# Patient Record
Sex: Male | Born: 1950 | Race: White | Hispanic: No | Marital: Single | Smoking: Former smoker
Health system: Southern US, Community
[De-identification: ages and names within clinical notes are randomized; demographics above are authoritative.]

## PROBLEM LIST (undated history)

## (undated) DIAGNOSIS — E785 Hyperlipidemia, unspecified: Secondary | ICD-10-CM

## (undated) DIAGNOSIS — Z720 Tobacco use: Secondary | ICD-10-CM

## (undated) DIAGNOSIS — I639 Cerebral infarction, unspecified: Secondary | ICD-10-CM

## (undated) DIAGNOSIS — I10 Essential (primary) hypertension: Secondary | ICD-10-CM

## (undated) DIAGNOSIS — I2510 Atherosclerotic heart disease of native coronary artery without angina pectoris: Secondary | ICD-10-CM

## (undated) DIAGNOSIS — T50995A Adverse effect of other drugs, medicaments and biological substances, initial encounter: Secondary | ICD-10-CM

## (undated) DIAGNOSIS — R739 Hyperglycemia, unspecified: Secondary | ICD-10-CM

## (undated) DIAGNOSIS — K219 Gastro-esophageal reflux disease without esophagitis: Secondary | ICD-10-CM

## (undated) HISTORY — PX: CHOLECYSTECTOMY: SHX55

## (undated) HISTORY — DX: Essential (primary) hypertension: I10

## (undated) HISTORY — DX: Hyperglycemia, unspecified: R73.9

## (undated) HISTORY — DX: Tobacco use: Z72.0

## (undated) HISTORY — DX: Adverse effect of other drugs, medicaments and biological substances, initial encounter: T50.995A

## (undated) HISTORY — DX: Hyperlipidemia, unspecified: E78.5

## (undated) HISTORY — DX: Atherosclerotic heart disease of native coronary artery without angina pectoris: I25.10

## (undated) HISTORY — DX: Gastro-esophageal reflux disease without esophagitis: K21.9

## (undated) HISTORY — PX: ROTATOR CUFF REPAIR: SHX139

---

## 2001-09-03 ENCOUNTER — Ambulatory Visit (HOSPITAL_COMMUNITY): Admission: RE | Admit: 2001-09-03 | Discharge: 2001-09-03 | Payer: Self-pay | Admitting: *Deleted

## 2004-04-10 ENCOUNTER — Encounter: Payer: Self-pay | Admitting: Physician Assistant

## 2010-10-03 ENCOUNTER — Encounter: Payer: Self-pay | Admitting: Cardiology

## 2010-10-04 ENCOUNTER — Encounter: Payer: Self-pay | Admitting: Cardiology

## 2010-10-05 ENCOUNTER — Encounter: Payer: Self-pay | Admitting: Cardiology

## 2010-10-06 ENCOUNTER — Inpatient Hospital Stay (HOSPITAL_COMMUNITY)
Admission: AD | Admit: 2010-10-06 | Discharge: 2010-10-09 | Payer: Self-pay | Attending: Internal Medicine | Admitting: Internal Medicine

## 2010-10-06 ENCOUNTER — Ambulatory Visit: Payer: Self-pay | Admitting: Cardiology

## 2010-10-06 ENCOUNTER — Encounter: Payer: Self-pay | Admitting: Cardiology

## 2010-10-06 ENCOUNTER — Encounter: Payer: Self-pay | Admitting: Physician Assistant

## 2010-10-06 DIAGNOSIS — R079 Chest pain, unspecified: Secondary | ICD-10-CM | POA: Insufficient documentation

## 2010-10-06 DIAGNOSIS — I10 Essential (primary) hypertension: Secondary | ICD-10-CM | POA: Insufficient documentation

## 2010-10-06 DIAGNOSIS — F17200 Nicotine dependence, unspecified, uncomplicated: Secondary | ICD-10-CM | POA: Insufficient documentation

## 2010-10-06 DIAGNOSIS — K219 Gastro-esophageal reflux disease without esophagitis: Secondary | ICD-10-CM | POA: Insufficient documentation

## 2010-10-06 DIAGNOSIS — E785 Hyperlipidemia, unspecified: Secondary | ICD-10-CM | POA: Insufficient documentation

## 2010-10-07 ENCOUNTER — Encounter: Payer: Self-pay | Admitting: Internal Medicine

## 2010-10-09 ENCOUNTER — Encounter: Payer: Self-pay | Admitting: Internal Medicine

## 2010-10-16 ENCOUNTER — Encounter: Payer: Self-pay | Admitting: Cardiology

## 2010-10-16 DIAGNOSIS — I2510 Atherosclerotic heart disease of native coronary artery without angina pectoris: Secondary | ICD-10-CM | POA: Insufficient documentation

## 2010-10-19 ENCOUNTER — Ambulatory Visit: Payer: Self-pay | Admitting: Cardiology

## 2010-11-28 NOTE — Assessment & Plan Note (Signed)
Summary: ROV ABNORMAL STRESS TEST AND CHEST PAIN   Visit Type:  Initial Consult Primary Provider:  Ashish Shah,MD  CC:  chest pain.  History of Present Illness: The patient is seen for evaluation of chest pain.  He had recently been hospitalized and appeared to be stable.  He had a nuclear exercise test on the day of his discharge.  There is question of inferior ischemia.  It was not a high risk scan and he was allowed to go home.  Decision was made for him to be seen rapidly in the office.  He's feeling relatively well today.  He has pain in his left chest which may be musculoskeletal.  However we are concerned about his nuclear scan.  In addition the patient's blood pressure was 180/125 when he was admitted to the hospital.  His blood pressure appeared to stabilize but today in the office his blood pressures 180/133 and one arm and 185/129 in the other arm..  Therefore he'll be admitted for blood pressure control and then proceed with cardiac catheterization and assessment of his renal arteries.  His chest pain is along the left lower anterior chest and left flank.  It has been persistent for several days.  The patient had undergone cardiac catheterization in 2002.  There was mild coronary disease at that time.  Preventive Screening-Counseling & Management  Alcohol-Tobacco     Smoking Status: quit     Year Quit: 1993     Cans of tobacco/week: chews  tobacco 2 packs/week     Tobacco Counseling: not to resume use of tobacco products  Current Medications (verified): 1)  Diovan 160 Mg Tabs (Valsartan) .... Take 1 Tablet By Mouth Once A Day 2)  Omeprazole 20 Mg Cpdr (Omeprazole) .... Take 1 Tablet By Mouth Once A Day 3)  Aspir-Low 81 Mg Tbec (Aspirin) .... Take 1 Tablet By Mouth Once A Day 4)  Androgel 25 Mg/2.5gm Gel (Testosterone) .... Apply One Packet Daily  Allergies (verified): 1)  ! Pcn 2)  ! * Ivp Dye  Comments:  Nurse/Medical Assistant: The patient's medication bottles and  allergies were reviewed with the patient and were updated in the Medication and Allergy Lists.  Past History:  Past Medical History: chest pain..... cardiac catheterization with noncritical disease 2002 /   nuclear scan October 05, 2010... inferior ischemia Dyslipidemia   intolerant to statins History of IVP dye allergy GERD Tobacco abuse Hypertension severe  Family History: There is a family history of coronary artery disease.  Social History: The patient is married.  He does use chewing tobacco.  Smoking Status:  quit Cans of tobacco/week:  chews  tobacco 2 packs/week  Review of Systems       The patient denies fever, chills, headache, sweats, rash, change in vision, change in hearing, chest pain, cough, nausea vomiting, urinary symptoms.  All other systems are reviewed and are negative.  Vital Signs:  Patient profile:   60 year old male Height:      67 inches Weight:      171 pounds BMI:     26.88 O2 Sat:      99 % on Room air Pulse rate:   67 / minute BP sitting:   182 / 133  (left arm) Cuff size:   regular  Vitals Entered By: Carlye Grippe (October 06, 2010 1:39 PM)  Nutrition Counseling: Patient's BMI is greater than 25 and therefore counseled on weight management options.  O2 Flow:  Room air  Serial  Vital Signs/Assessments:  Time      Position  BP       Pulse  Resp  Temp     By 1:44 PM             177/128  68                    Lydia Anderson                                PEF    PreRx  PostRx Time      O2 Sat  O2 Type     L/min  L/min  L/min   By 1:44 PM   98  %                                     Carlye Grippe  Comments: 1:44 PM right arm/reg cuff By: Carlye Grippe    Physical Exam  General:  patient is stable today. Head:  head is atraumatic. Eyes:  no xanthelasma. Neck:  no jugular venous distention. Chest Wall:  no chest wall tenderness. Lungs:  lungs are clear.  Respiratory effort is nonlabored. Heart:  cardiac exam reveals a fullness  superiorly no clicks or significant murmurs. Abdomen:  abdomen is soft. Msk:  no musculoskeletal deformities. Extremities:  no peripheral edema. Skin:  no skin rashes. Psych:  patient is oriented to person time and place her affect is normal.  He is here with his wife today.   Impression & Recommendations:  Problem # 1:  TOBACCO ABUSE (ICD-305.1) The patient is counseled to try to stop chewing tobacco  Problem # 2:  * IVP DYE ALLERGY There is an IVP dye allergy.  We will still cover him with possible contrast allergy , unless we can show that the patient did not require prophylaxis at the time of his catheter in 2002.  Problem # 3:  DYSLIPIDEMIA (ICD-272.4) Historically he cannot use statins.  We will try over time to see if he can try a new statin.  Problem # 4:  CHEST PAIN (ICD-786.50)  His updated medication list for this problem includes:    Aspir-low 81 Mg Tbec (Aspirin) .Marland Kitchen... Take 1 tablet by mouth once a day The patient's current chest pain is concerning.  I believe there is a musculoskeletal component to it.  However his nuclear scan reveals inferior ischemia.  He does have mild disease in the past.  He and his wife are very concerned and wanted complete evaluation.  We will proceed with cardiac catheterization.  Problem # 5:  HYPERTENSION (ICD-401.9)  His updated medication list for this problem includes:    Diovan 160 Mg Tabs (Valsartan) .Marland Kitchen... Take 1 tablet by mouth once a day    Aspir-low 81 Mg Tbec (Aspirin) .Marland Kitchen... Take 1 tablet by mouth once a day The patient's hypertension is very concerning.  It seems to go up and down significantly.  Today in the office is 180/130 bilaterally.  Is not having any symptoms of uncontrolled hypertension.  I'm very comfortable treating this as an outpatient especially when he's been having chest pain.  He'll be admitted to have his meds adjusted.  We will then proceed electively with cardiac catheterization and assess his renal  arteries.  Other Orders: EKG w/ Interpretation (93000)

## 2010-11-28 NOTE — Consult Note (Signed)
Summary: CARDIOLOGY CONSULT/ [facility]  CARDIOLOGY CONSULT/ [facility]   Imported By: Zachary George 10/06/2010 12:32:29  _____________________________________________________________________  External Attachment:    Type:   Image     Comment:   External Document

## 2010-11-30 NOTE — Assessment & Plan Note (Signed)
Summary: [facility]-POST CATH   Visit Type:  Follow-up Primary Provider:  Beatrix Fetters Shah,MD  CC:  chest pain.  History of Present Illness: The patient is seen back after his cardiac catheterization.  When I saw him in the office on October 06, 2010.  I was concerned about his blood pressure and about the results of his nuclear scan that had shown some inferior ischemia.  It is very well documented that I decided to admit him to the hospital that day because of marked swings in his blood pressure.  In the hospital his blood pressure stabilized.  He underwent cardiac catheterization on October 09, 2010.  He has mild to moderate coronary disease.  There was no critical lesions.  There appeared to be no major change since 2002.  He needs aggressive secondary prevention.  Unfortunately he is intolerant to statins.  Preventive Screening-Counseling & Management  Alcohol-Tobacco     Smoking Status: quit     Year Quit: 1993     Cans of tobacco/week: chew 2packs tobacco/week     Tobacco Counseling: not to resume use of tobacco products  Current Medications (verified): 1)  Diovan 160 Mg Tabs (Valsartan) .... Take 1 Tablet By Mouth Once A Day 2)  Omeprazole 20 Mg Cpdr (Omeprazole) .... Take 1 Tablet By Mouth Once A Day 3)  Aspir-Low 81 Mg Tbec (Aspirin) .... Take 1 Tablet By Mouth Once A Day 4)  Amlodipine Besylate 5 Mg Tabs (Amlodipine Besylate) .... Take 1 Tablet By Mouth Once A Day 5)  Carvedilol 6.25 Mg Tabs (Carvedilol) .... Take 1 Tablet By Mouth Two Times A Day 6)  Alprazolam 0.5 Mg Tabs (Alprazolam) .... Take 1 /2 Tablet By Mouth Once A Day At Bedtime 7)  Flexeril 10 Mg Tabs (Cyclobenzaprine Hcl) .... Take 1 Tablet By Mouth Three Times A Day As Needed 8)  Tylenol 325 Mg Tabs (Acetaminophen) .... Take 1-2 Tablet By Mouth Three Times A Day As Needed 9)  Gaviscon Extra Relief Formula 160-105 Mg Chew (Alum Hydroxide-Mag Carbonate) .... As Needed 10)  Nystatin-Triamcinolone 100000-0.1 Unit/gm-% Crea  (Nystatin-Triamcinolone) .... Apply Topically To Elbows Daily  Allergies (verified): 1)  ! Pcn 2)  ! * Ivp Dye  Comments:  Nurse/Medical Assistant: The patient's medication list and allergies were reviewed with the patient and were updated in the Medication and Allergy Lists.  Past History:  Past Medical History: chest pain..... cardiac catheterization with noncritical disease 2002 /   nuclear scan October 05, 2010... inferior ischemia  /  catheterization October 09, 2010, mild nonobstructive disease, medical therapy...renal arteries not studied EF  65%.... catheterization... December, 2011 IV contrast allergy??   premedicate for catheter December, 2011 Glucose elevation.... December, 2011.... needs primary care evaluation Dyslipidemia   intolerant to statins GERD Tobacco abuse Hypertension .Marland Kitchen.. pressure was high at [facility]... renal arteriogram not done at cath 09/2010... pressure stabilized with medication adjustments  Social History: Cans of tobacco/week:  chew 2packs tobacco/week  Review of Systems       Patient denies fever, chills, headache, sweats, rash, change in vision, change in hearing, chest pain, cough, nausea vomiting, urinary symptoms.  All other systems are reviewed and are negative.  Vital Signs:  Patient profile:   60 year old male Height:      67 inches Weight:      170 pounds Pulse rate:   59 / minute BP sitting:   130 / 84  (left arm) Cuff size:   regular  Vitals Entered By: Carlye Grippe (  October 19, 2010 10:11 AM)  Physical Exam  General:  Patient is stable today. Head:  head is atraumatic. Eyes:  no xanthelasma. Neck:  no jugular venous distention. Chest Wall:  no chest wall tenderness. Lungs:  lungs are clear.  Respiratory effort is nonlabored. Heart:  cardiac exam reveals an S1-S2.  No clicks or significant murmurs. Abdomen:  abdomen soft. Msk:  no musculoskeletal deformities. Extremities:  no peripheral edema. Psych:  patient  is oriented to person time and place.  Affect is normal.   Impression & Recommendations:  Problem # 1:  CAD (ICD-414.00) Catheterization has shown only mild/moderate disease.  He needs secondary prevention.  Unfortunately he is intolerant to statins.  He is on the appropriate other medications.  It is my understanding that his insurance company may be refusing to pay for some of his recent hospitalization.  The reason for the hospitalization and the reason for cardiac catheterization are very well documented on my note of October 06, 2010. he had proven coronary disease in the past.  He had chest pain.  He had an abnormal nuclear scan.  He had blood pressure that was not well controlled at the time of the admission.  Problem # 2:  * IVP DYE ALLERGY The patient did receive prophylaxis for possible contrast allergy.  From the records available I cannot absolutely document that this problem was proven in the past.  Problem # 3:  HYPERTENSION (ICD-401.9) blood pressure is nicely controlled today.  The patient did not have assessment of his renal arteries in the catheter lab.  There is no need today for further evaluation.  His blood pressure can be followed carefully.  Patient Instructions: 1)  Your physician wants you to follow-up in: 3 months. You will receive a reminder letter in the mail one-two months in advance. If you don't receive a letter, please call our office to schedule the follow-up appointment. 2)  Your physician recommends that you continue on your current medications as directed. Please refer to the Current Medication list given to you today.

## 2010-11-30 NOTE — Miscellaneous (Signed)
  Clinical Lists Changes  Problems: Added new problem of CAD (ICD-414.00) Observations: Added new observation of PAST MED HX: chest pain..... cardiac catheterization with noncritical disease 2002 /   nuclear scan October 05, 2010... inferior ischemia  /  catheterization October 09, 2010, mild nonobstructive disease, medical therapy...renal arteries not studied EF  65%.... catheterization... December, 2011 IV contrast allergy??   premedicate for catheter December, 2011 Glucose elevation.... December, 2011.... needs primary care evaluation Dyslipidemia   intolerant to statins GERD Tobacco abuse Hypertension .Marland Kitchen.. pressure was high at [facility]... renal arteriogram not done at cath 09/2010 (10/16/2010 10:22) Added new observation of PRIMARY MD: Beatrix Fetters Shah,MD (10/16/2010 10:22)       Past History:  Past Medical History: chest pain..... cardiac catheterization with noncritical disease 2002 /   nuclear scan October 05, 2010... inferior ischemia  /  catheterization October 09, 2010, mild nonobstructive disease, medical therapy...renal arteries not studied EF  65%.... catheterization... December, 2011 IV contrast allergy??   premedicate for catheter December, 2011 Glucose elevation.... December, 2011.... needs primary care evaluation Dyslipidemia   intolerant to statins GERD Tobacco abuse Hypertension .Marland Kitchen.. pressure was high at [facility]... renal arteriogram not done at cath 09/2010

## 2010-11-30 NOTE — Letter (Signed)
Summary: Internal Correspondence/ PRE-CATH ORDER & NOTE  Internal Correspondence/ PRE-CATH ORDER & NOTE   Imported By: Dorise Hiss 10/11/2010 13:53:45  _____________________________________________________________________  External Attachment:    Type:   Image     Comment:   External Document

## 2011-01-09 LAB — CBC
HCT: 44.8 % (ref 39.0–52.0)
Hemoglobin: 14 g/dL (ref 13.0–17.0)
MCH: 29 pg (ref 26.0–34.0)
MCH: 29.1 pg (ref 26.0–34.0)
MCV: 83.6 fL (ref 78.0–100.0)
Platelets: 238 10*3/uL (ref 150–400)
RDW: 12.9 % (ref 11.5–15.5)
WBC: 6 10*3/uL (ref 4.0–10.5)

## 2011-01-09 LAB — CARDIAC PANEL(CRET KIN+CKTOT+MB+TROPI)
CK, MB: 1.1 ng/mL (ref 0.3–4.0)
CK, MB: 1.1 ng/mL (ref 0.3–4.0)
CK, MB: 1.3 ng/mL (ref 0.3–4.0)
Relative Index: 1.2 (ref 0.0–2.5)
Relative Index: INVALID (ref 0.0–2.5)
Total CK: 85 U/L (ref 7–232)
Total CK: 95 U/L (ref 7–232)
Troponin I: 0.02 ng/mL (ref 0.00–0.06)

## 2011-01-09 LAB — COMPREHENSIVE METABOLIC PANEL
ALT: 21 U/L (ref 0–53)
AST: 19 U/L (ref 0–37)
Albumin: 3.8 g/dL (ref 3.5–5.2)
Alkaline Phosphatase: 92 U/L (ref 39–117)
Calcium: 8.7 mg/dL (ref 8.4–10.5)
Chloride: 108 mEq/L (ref 96–112)
Potassium: 3.6 mEq/L (ref 3.5–5.1)
Total Bilirubin: 0.3 mg/dL (ref 0.3–1.2)
Total Protein: 6.3 g/dL (ref 6.0–8.3)

## 2011-01-09 LAB — BASIC METABOLIC PANEL
BUN: 15 mg/dL (ref 6–23)
CO2: 25 mEq/L (ref 19–32)
Creatinine, Ser: 0.89 mg/dL (ref 0.4–1.5)
Glucose, Bld: 157 mg/dL — ABNORMAL HIGH (ref 70–99)
Potassium: 3.8 mEq/L (ref 3.5–5.1)
Sodium: 140 mEq/L (ref 135–145)

## 2011-01-09 LAB — PROTIME-INR: INR: 1.09 (ref 0.00–1.49)

## 2011-01-09 LAB — LIPID PANEL: LDL Cholesterol: 113 mg/dL — ABNORMAL HIGH (ref 0–99)

## 2011-01-12 ENCOUNTER — Encounter: Payer: Self-pay | Admitting: *Deleted

## 2011-01-22 ENCOUNTER — Ambulatory Visit: Payer: Self-pay | Admitting: Cardiology

## 2011-01-22 ENCOUNTER — Encounter: Payer: Self-pay | Admitting: Cardiology

## 2011-01-22 DIAGNOSIS — Z720 Tobacco use: Secondary | ICD-10-CM | POA: Insufficient documentation

## 2011-01-22 DIAGNOSIS — K219 Gastro-esophageal reflux disease without esophagitis: Secondary | ICD-10-CM | POA: Insufficient documentation

## 2011-01-22 DIAGNOSIS — I10 Essential (primary) hypertension: Secondary | ICD-10-CM | POA: Insufficient documentation

## 2011-01-22 DIAGNOSIS — E785 Hyperlipidemia, unspecified: Secondary | ICD-10-CM | POA: Insufficient documentation

## 2011-01-22 DIAGNOSIS — I2510 Atherosclerotic heart disease of native coronary artery without angina pectoris: Secondary | ICD-10-CM | POA: Insufficient documentation

## 2011-03-16 NOTE — Cardiovascular Report (Signed)
[place]  Patient:    Matthew Ward, Matthew Ward Visit Number: [id number] MRN: [id number]          Service Type: CAT Location: [facility] [id number] Attending Physician:  Daisey Must Dictated by:   Daisey Must, M.D. [facility] Proc. Date: 09/03/01 Admit Date:  09/03/2001 Discharge Date: 09/03/2001   CC:         Wende Crease, M.D.  [facility]  Cardiac Catheterization Laboratory   Cardiac Catheterization  PROCEDURES PERFORMED: Left heart catheterization with coronary angiography and left ventriculography.  INDICATIONS: The patient is a 60 year old male with a history of hypertension and atypical chest pain. A stress echocardiogram done three months ago showed no evidence of ischemia. However, because of the recurrent nature of his chest pain, he was referred for cardiac catheterization to rule out significant coronary artery disease.  DESCRIPTION OF PROCEDURE: A 6 French sheath was placed in the right femoral artery.  Standard Judkins 6 French catheters were utilized.  Contrast was Omnipaque.  There were no complications.  RESULTS:  HEMODYNAMICS: Left ventricular pressure 168/25. Aortic pressure 180/108. There was no aortic valve gradient.  LEFT VENTRICULOGRAM: Wall motion is normal. Ejection fraction is calculated at 64%. There is trace mitral regurgitation.  CORONARY ARTERIOGRAPHY: (Right dominant).  Left main: Normal.  Left anterior descending: The left anterior descending artery has a discrete 50% stenosis in the proximal vessel. The mid vessel has a tubular 30% stenosis. There is a small first diagonal which has a 30% stenosis at its origin. The second diagonal is a large vessel.  Left circumflex: The left circumflex gives rise to a small OM-1 and a normal sized OM-2 and a small OM-3. OM-2 has a tubular 60-70% stenosis at its origin.  Right coronary artery: The right coronary artery is a very large dominant vessel. There are minor  luminal irregularities in the mid and distal vessel. The right coronary artery gives rise to a large acute marginal branch which supplies a portion of the inferior septum.  There is a small posterior descending artery and a normal sized posterolateral branch.  IMPRESSIONS: 1. Normal left ventricular systolic function. 2. Two-vessel coronary artery disease of borderline severity as described    involving the left anterior descending artery and the second obtuse    marginal branch. Neither of these are clearly hemodynamically significant.  PLAN: An outpatient stress nuclear scan will be obtained to further assess for ischemia. If it does show ischemia in either the LAD or the circumflex distribution, percutaneous intervention would be an option. If there is no ischemia noted on this scan, would recommend aggressive medical therapy with risk factor modification. Dictated by:   Daisey Must, M.D. [facility] Attending Physician:  Daisey Must DD:  09/03/01 TD:  09/04/01 Job: [id number]

## 2012-08-19 DIAGNOSIS — R55 Syncope and collapse: Secondary | ICD-10-CM

## 2012-08-19 DIAGNOSIS — R079 Chest pain, unspecified: Secondary | ICD-10-CM

## 2018-06-05 ENCOUNTER — Encounter (INDEPENDENT_AMBULATORY_CARE_PROVIDER_SITE_OTHER): Payer: Self-pay | Admitting: *Deleted

## 2020-02-29 DIAGNOSIS — I10 Essential (primary) hypertension: Secondary | ICD-10-CM | POA: Diagnosis not present

## 2020-02-29 DIAGNOSIS — Z299 Encounter for prophylactic measures, unspecified: Secondary | ICD-10-CM | POA: Diagnosis not present

## 2020-02-29 DIAGNOSIS — Z87891 Personal history of nicotine dependence: Secondary | ICD-10-CM | POA: Diagnosis not present

## 2020-02-29 DIAGNOSIS — W57XXXA Bitten or stung by nonvenomous insect and other nonvenomous arthropods, initial encounter: Secondary | ICD-10-CM | POA: Diagnosis not present

## 2020-03-11 DIAGNOSIS — Z789 Other specified health status: Secondary | ICD-10-CM | POA: Diagnosis not present

## 2020-03-11 DIAGNOSIS — I10 Essential (primary) hypertension: Secondary | ICD-10-CM | POA: Diagnosis not present

## 2020-03-11 DIAGNOSIS — Z299 Encounter for prophylactic measures, unspecified: Secondary | ICD-10-CM | POA: Diagnosis not present

## 2020-03-11 DIAGNOSIS — E7800 Pure hypercholesterolemia, unspecified: Secondary | ICD-10-CM | POA: Diagnosis not present

## 2020-03-11 DIAGNOSIS — G629 Polyneuropathy, unspecified: Secondary | ICD-10-CM | POA: Diagnosis not present

## 2020-04-20 ENCOUNTER — Emergency Department (HOSPITAL_COMMUNITY): Payer: No Typology Code available for payment source

## 2020-04-20 ENCOUNTER — Encounter (HOSPITAL_COMMUNITY): Payer: Self-pay

## 2020-04-20 ENCOUNTER — Emergency Department (HOSPITAL_COMMUNITY)
Admission: EM | Admit: 2020-04-20 | Discharge: 2020-04-20 | Disposition: A | Discharge status: Home / Self Care | Payer: No Typology Code available for payment source | Attending: Emergency Medicine | Admitting: Emergency Medicine

## 2020-04-20 DIAGNOSIS — R202 Paresthesia of skin: Secondary | ICD-10-CM | POA: Insufficient documentation

## 2020-04-20 DIAGNOSIS — I10 Essential (primary) hypertension: Secondary | ICD-10-CM | POA: Insufficient documentation

## 2020-04-20 DIAGNOSIS — R519 Headache, unspecified: Secondary | ICD-10-CM | POA: Insufficient documentation

## 2020-04-20 DIAGNOSIS — I2510 Atherosclerotic heart disease of native coronary artery without angina pectoris: Secondary | ICD-10-CM | POA: Diagnosis not present

## 2020-04-20 DIAGNOSIS — J3489 Other specified disorders of nose and nasal sinuses: Secondary | ICD-10-CM | POA: Diagnosis not present

## 2020-04-20 DIAGNOSIS — Z87891 Personal history of nicotine dependence: Secondary | ICD-10-CM | POA: Diagnosis not present

## 2020-04-20 DIAGNOSIS — J341 Cyst and mucocele of nose and nasal sinus: Secondary | ICD-10-CM | POA: Diagnosis not present

## 2020-04-20 DIAGNOSIS — I7090 Unspecified atherosclerosis: Secondary | ICD-10-CM | POA: Diagnosis not present

## 2020-04-20 DIAGNOSIS — J322 Chronic ethmoidal sinusitis: Secondary | ICD-10-CM | POA: Diagnosis not present

## 2020-04-20 DIAGNOSIS — I6381 Other cerebral infarction due to occlusion or stenosis of small artery: Secondary | ICD-10-CM | POA: Diagnosis not present

## 2020-04-20 LAB — CBC
HCT: 40.1 % (ref 39.0–52.0)
Hemoglobin: 13.4 g/dL (ref 13.0–17.0)
MCH: 28.8 pg (ref 26.0–34.0)
MCHC: 33.4 g/dL (ref 30.0–36.0)
MCV: 86.2 fL (ref 80.0–100.0)
Platelets: 256 10*3/uL (ref 150–400)
RBC: 4.65 MIL/uL (ref 4.22–5.81)
RDW: 13 % (ref 11.5–15.5)
WBC: 6.3 10*3/uL (ref 4.0–10.5)
nRBC: 0 % (ref 0.0–0.2)

## 2020-04-20 LAB — TROPONIN I (HIGH SENSITIVITY)
Troponin I (High Sensitivity): 2 ng/L (ref <18)
Troponin I (High Sensitivity): 3 ng/L (ref <18)

## 2020-04-20 LAB — COMPREHENSIVE METABOLIC PANEL
ALT: 27 U/L (ref 0–44)
AST: 20 U/L (ref 15–41)
Albumin: 4.1 g/dL (ref 3.5–5.0)
Alkaline Phosphatase: 100 U/L (ref 38–126)
Anion gap: 9 (ref 5–15)
BUN: 16 mg/dL (ref 8–23)
CO2: 27 mmol/L (ref 22–32)
Calcium: 9 mg/dL (ref 8.9–10.3)
Chloride: 99 mmol/L (ref 98–111)
Creatinine, Ser: 0.95 mg/dL (ref 0.61–1.24)
GFR calc Af Amer: >60 mL/min (ref >60)
GFR calc non Af Amer: >60 mL/min (ref >60)
Glucose, Bld: 89 mg/dL (ref 70–99)
Potassium: 4.4 mmol/L (ref 3.5–5.1)
Sodium: 135 mmol/L (ref 135–145)
Total Bilirubin: 0.7 mg/dL (ref 0.3–1.2)
Total Protein: 7 g/dL (ref 6.5–8.1)

## 2020-04-20 LAB — MAGNESIUM: Magnesium: 2.1 mg/dL (ref 1.7–2.4)

## 2020-04-20 NOTE — ED Provider Notes (Signed)
[facility] EMERGENCY DEPARTMENT Provider Note   CSN: [id number] Arrival date & time: 04/20/20  1328     History Chief Complaint  Patient presents with  . Numbness    Matthew Ward is a 69 y.o. male.  HPI 69 year old male presents with numbness.  Started 2 days ago.  It involves his right lips, right hand/fingers, and right toes.  Was about 15 minutes or so the first day, 2 days ago.  Occurred again yesterday and lasted about 30 minutes.  Today it came on around 10 AM and has not gone away.  There is a mild headache, about 2 out of 10 today.  A little bit of a headache yesterday.  It is a frontal headache.  Went away with Tylenol yesterday.  No trouble speaking or weakness.  He does have a little photophobia.  Past Medical History:  Diagnosis Date  . Allergy to IVP dye   . Blood glucose elevated    December, 2011.... needs primary care evaluation  . CAD (coronary artery disease)    cardiac catheterization with noncritical disease 2002 /   nuclear scan October 05, 2010... inferior ischemia  /  catheterization October 09, 2010, mild nonobstructive disease, medical therapy...renal arteries not studied, EF 65%  . CAD in native artery    EF 65%, catheterization, December, 2011  . Dyslipidemia    Intolerant to Statins  . GERD (gastroesophageal reflux disease)   . Hypertension    pressure was high at [facility]... renal arteriogram not done at cath 09/2010... pressure stabilized with medication adjustments  . Tobacco abuse     Patient Active Problem List   Diagnosis Date Noted  . CAD (coronary artery disease)   . Dyslipidemia   . GERD (gastroesophageal reflux disease)   . Tobacco abuse   . Hypertension   . CAD 10/16/2010  . DYSLIPIDEMIA 10/06/2010  . TOBACCO ABUSE 10/06/2010  . HYPERTENSION 10/06/2010  . GERD 10/06/2010  . CHEST PAIN 10/06/2010    History reviewed. No pertinent surgical history.     Family History  Problem Relation Age of Onset  . Coronary  artery disease Neg Hx     Social History   Tobacco Use  . Smoking status: Former Smoker    Quit date: 10/30/1991    Years since quitting: 28.4  . Smokeless tobacco: Current User    Types: Chew  . Tobacco comment: Chews 2 pks per week.  Substance Use Topics  . Alcohol use: Never  . Drug use: Never    Home Medications Prior to Admission medications   Medication Sig Start Date End Date Taking? Authorizing Provider  acetaminophen (TYLENOL) 325 MG tablet Take 1-2 tablets by mouth three times a day as needed.     [provider]  ALPRAZolam Prudy Feeler) 0.5 MG tablet Take 1/2 tablet by mouth at bedtime.     [provider]  Alum Hydroxide-Mag Carbonate (GAVISCON EXTRA RELIEF FORMULA) 160-105 MG CHEW Chew 1 tablet by mouth as needed.      [provider]  amLODipine (NORVASC) 5 MG tablet Take 1 tablet by mouth daily.     [provider]  aspirin 81 MG tablet Take 1 tablet by mouth daily.     [provider]  carvedilol (COREG) 6.25 MG tablet Take 1 tablet by mouth two times a day.     [provider]  cyclobenzaprine (FLEXERIL) 10 MG tablet Take 1 tablet by mouth three times per day as needed.  [provider]  nystatin (MYCOSTATIN) cream Apply topically to elbows daily.     [provider]  omeprazole (PRILOSEC OTC) 20 MG tablet Take 1 tablet by mouth daily.     [provider]  valsartan (DIOVAN) 160 MG tablet Take 1 tablet by mouth daily.     [provider]    Allergies    Iodinated diagnostic agents, Penicillins, Sulfa antibiotics, and Sulfur  Review of Systems   Review of Systems  Neurological: Positive for numbness and headaches. Negative for weakness.  All other systems reviewed and are negative.   Physical Exam Updated Vital Signs BP (!) 178/97   Pulse (!) 49   Temp 98.4 F (36.9 C) (Oral)   Resp 12   Ht 5\' 6"  (1.676 m)   Wt 74.4 kg   SpO2 97%   BMI 26.47 kg/m   Physical  Exam Vitals and nursing note reviewed.  Constitutional:      General: He is not in acute distress.    Appearance: He is well-developed. He is not ill-appearing or diaphoretic.  HENT:     Head: Normocephalic and atraumatic.     Right Ear: External ear normal.     Left Ear: External ear normal.     Nose: Nose normal.  Eyes:     General:        Right eye: No discharge.        Left eye: No discharge.     Extraocular Movements: Extraocular movements intact.     Pupils: Pupils are equal, round, and reactive to light.  Cardiovascular:     Rate and Rhythm: Normal rate and regular rhythm.     Heart sounds: Normal heart sounds.  Pulmonary:     Effort: Pulmonary effort is normal.     Breath sounds: Normal breath sounds.  Abdominal:     Palpations: Abdomen is soft.     Tenderness: There is no abdominal tenderness.  Musculoskeletal:     Cervical back: Neck supple.  Skin:    General: Skin is warm and dry.  Neurological:     Mental Status: He is alert.     Comments: CN 3-12 grossly intact. 5/5 strength in all 4 extremities. Subjective decreased sensation to right lateral lips and right finger tips. Normal finger to nose.   Psychiatric:        Mood and Affect: Mood is not anxious.     ED Results / Procedures / Treatments   Labs (all labs ordered are listed, but only abnormal results are displayed) Labs Reviewed  CBC  COMPREHENSIVE METABOLIC PANEL  MAGNESIUM  TROPONIN I (HIGH SENSITIVITY)  TROPONIN I (HIGH SENSITIVITY)    EKG EKG Interpretation  Date/Time:  Wednesday April 20 2020 14:24:27 EDT Ventricular Rate:  54 PR Interval:  148 QRS Duration: 74 QT Interval:  442 QTC Calculation: 419 R Axis:   32 Text Interpretation: Sinus bradycardia no acute ST/T changes similar to 2011 Confirmed by [phone]) on 04/20/2020 5:44:16 PM   Radiology CT Head Wo Contrast  Result Date: 04/20/2020 CLINICAL DATA:  Left-sided tingling EXAM: CT HEAD WITHOUT CONTRAST TECHNIQUE:  Contiguous axial images were obtained from the base of the skull through the vertex without intravenous contrast. COMPARISON:  None. FINDINGS: Brain: There is no acute intracranial hemorrhage, mass effect, or edema. Gray-white differentiation is preserved. There is no extra-axial fluid collection. Ventricles and sulci are within normal limits in size and configuration. Vascular: There is atherosclerotic calcification at the skull base.  Skull: Calvarium is unremarkable. Sinuses/Orbits: Mild mucosal thickening. Other: None. IMPRESSION: No acute intracranial abnormality. Electronically Signed   By: Macy Mis M.D.   On: 04/20/2020 15:23   MR BRAIN WO CONTRAST  Result Date: 04/20/2020 CLINICAL DATA:  Focal neuro deficit, greater than 6 hours, stroke suspected. Additional history provided: Progressive numbness on right side of lips, right hand and right foot which began Tuesday. EXAM: MRI HEAD WITHOUT CONTRAST TECHNIQUE: Multiplanar, multiecho pulse sequences of the brain and surrounding structures were obtained without intravenous contrast. COMPARISON:  Noncontrast head CT performed earlier the same day. FINDINGS: Brain: Cerebral volume is normal for age. There are tiny chronic lacunar infarcts within the posterior subinsular regions and thalami bilaterally. No other focal parenchymal signal abnormality is identified. There is no acute infarct. No evidence of intracranial mass. No chronic intracranial blood products. No extra-axial fluid collection. No midline shift. Vascular: Expected proximal arterial flow voids. Skull and upper cervical spine: No focal marrow lesion. Sinuses/Orbits: Visualized orbits show no acute finding. Mild paranasal sinus mucosal thickening, most notably ethmoidal. Small bilateral maxillary sinus mucous retention cyst. No significant mastoid effusion. IMPRESSION: No evidence of acute intracranial abnormality, including acute infarction. 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 Otherwise unremarkable MRI appearance of the brain for age. Mild paranasal sinus mucosal thickening, most notably ethmoidal. Small bilateral maxillary sinus mucous retention cysts. Electronically Signed   By: Kellie Simmering DO   On: 04/20/2020 18:35    Procedures Procedures (including critical care time)  Medications Ordered in ED Medications - No data to display  ED Course  I have reviewed the triage vital signs and the nursing notes.  Pertinent labs & imaging results that were available during my care of the patient were reviewed by me and considered in my medical decision making (see chart for details).    MDM Rules/Calculators/A&P                          Given right sided symptoms, MRI was obtained. This was personally reviewed as well as interpreted by radiology. No acute stroke. Given lip involvement, unlikely to be a C-spine issue. While it could be such a small stroke that it's not seen, this seems less likely.  At this point, will discharge to follow-up with neurology for the paresthesias.  Labs are reviewed and are unremarkable including no significant electrolyte disturbance.  Has minimal headache but it seems unlikely this is a complicated migraine.  I also highly doubt CNS infection. Final Clinical Impression(s) / ED Diagnoses Final diagnoses:  Paresthesia    Rx / DC Orders ED Discharge Orders    None       Sherwood Gambler, MD 04/20/20 [place]

## 2020-04-20 NOTE — Discharge Instructions (Signed)
If you develop new or worsening headache, new or worsening weakness, trouble speaking or swallowing, vision changes, or any other new/concerning symptoms then return to the ER for evaluation.

## 2020-04-20 NOTE — ED Triage Notes (Signed)
Pt has been having progressive numbness on the right side of his lips, right hand, and right foot. This began on Tuesday. It is now getting more pronounced. Denies any history of a stroke. Takes an 81 mg Aspirin daily. History of neuropathy

## 2020-04-26 ENCOUNTER — Other Ambulatory Visit (HOSPITAL_COMMUNITY): Payer: Self-pay | Admitting: Neurology

## 2020-04-26 ENCOUNTER — Other Ambulatory Visit: Payer: Self-pay | Admitting: Neurology

## 2020-04-26 DIAGNOSIS — I639 Cerebral infarction, unspecified: Secondary | ICD-10-CM

## 2020-04-29 ENCOUNTER — Ambulatory Visit (HOSPITAL_COMMUNITY)
Admission: RE | Admit: 2020-04-29 | Discharge: 2020-04-29 | Disposition: A | Discharge status: Home / Self Care | Payer: Medicare Other | Source: Ambulatory Visit | Attending: Neurology | Admitting: Neurology

## 2020-04-29 ENCOUNTER — Other Ambulatory Visit: Payer: Self-pay

## 2020-04-29 DIAGNOSIS — I639 Cerebral infarction, unspecified: Secondary | ICD-10-CM

## 2020-04-29 DIAGNOSIS — Z8673 Personal history of transient ischemic attack (TIA), and cerebral infarction without residual deficits: Secondary | ICD-10-CM | POA: Diagnosis not present

## 2020-04-29 DIAGNOSIS — I6523 Occlusion and stenosis of bilateral carotid arteries: Secondary | ICD-10-CM | POA: Diagnosis not present

## 2020-06-29 DIAGNOSIS — Z299 Encounter for prophylactic measures, unspecified: Secondary | ICD-10-CM | POA: Diagnosis not present

## 2020-06-29 DIAGNOSIS — I10 Essential (primary) hypertension: Secondary | ICD-10-CM | POA: Diagnosis not present

## 2020-06-29 DIAGNOSIS — D692 Other nonthrombocytopenic purpura: Secondary | ICD-10-CM | POA: Diagnosis not present

## 2020-06-29 DIAGNOSIS — E7800 Pure hypercholesterolemia, unspecified: Secondary | ICD-10-CM | POA: Diagnosis not present

## 2020-06-29 DIAGNOSIS — G459 Transient cerebral ischemic attack, unspecified: Secondary | ICD-10-CM | POA: Diagnosis not present

## 2020-07-13 DIAGNOSIS — Z299 Encounter for prophylactic measures, unspecified: Secondary | ICD-10-CM | POA: Diagnosis not present

## 2020-07-13 DIAGNOSIS — Z0000 Encounter for general adult medical examination without abnormal findings: Secondary | ICD-10-CM | POA: Diagnosis not present

## 2020-07-13 DIAGNOSIS — Z1211 Encounter for screening for malignant neoplasm of colon: Secondary | ICD-10-CM | POA: Diagnosis not present

## 2020-07-13 DIAGNOSIS — Z7189 Other specified counseling: Secondary | ICD-10-CM | POA: Diagnosis not present

## 2020-07-14 DIAGNOSIS — Z79899 Other long term (current) drug therapy: Secondary | ICD-10-CM | POA: Diagnosis not present

## 2020-07-14 DIAGNOSIS — R5383 Other fatigue: Secondary | ICD-10-CM | POA: Diagnosis not present

## 2020-07-14 DIAGNOSIS — E7800 Pure hypercholesterolemia, unspecified: Secondary | ICD-10-CM | POA: Diagnosis not present

## 2020-07-28 DIAGNOSIS — I10 Essential (primary) hypertension: Secondary | ICD-10-CM | POA: Diagnosis not present

## 2020-07-28 DIAGNOSIS — E7800 Pure hypercholesterolemia, unspecified: Secondary | ICD-10-CM | POA: Diagnosis not present

## 2020-08-26 DIAGNOSIS — I10 Essential (primary) hypertension: Secondary | ICD-10-CM | POA: Diagnosis not present

## 2020-08-26 DIAGNOSIS — E7800 Pure hypercholesterolemia, unspecified: Secondary | ICD-10-CM | POA: Diagnosis not present

## 2020-09-09 DIAGNOSIS — Z23 Encounter for immunization: Secondary | ICD-10-CM | POA: Diagnosis not present

## 2020-10-31 DIAGNOSIS — Z87891 Personal history of nicotine dependence: Secondary | ICD-10-CM | POA: Diagnosis not present

## 2020-10-31 DIAGNOSIS — I10 Essential (primary) hypertension: Secondary | ICD-10-CM | POA: Diagnosis not present

## 2020-10-31 DIAGNOSIS — G459 Transient cerebral ischemic attack, unspecified: Secondary | ICD-10-CM | POA: Diagnosis not present

## 2020-10-31 DIAGNOSIS — M542 Cervicalgia: Secondary | ICD-10-CM | POA: Diagnosis not present

## 2020-10-31 DIAGNOSIS — G5601 Carpal tunnel syndrome, right upper limb: Secondary | ICD-10-CM | POA: Diagnosis not present

## 2020-10-31 DIAGNOSIS — Z299 Encounter for prophylactic measures, unspecified: Secondary | ICD-10-CM | POA: Diagnosis not present

## 2021-03-07 DIAGNOSIS — I2510 Atherosclerotic heart disease of native coronary artery without angina pectoris: Secondary | ICD-10-CM | POA: Diagnosis not present

## 2021-03-07 DIAGNOSIS — R42 Dizziness and giddiness: Secondary | ICD-10-CM | POA: Diagnosis not present

## 2021-03-07 DIAGNOSIS — F17210 Nicotine dependence, cigarettes, uncomplicated: Secondary | ICD-10-CM | POA: Diagnosis not present

## 2021-03-07 DIAGNOSIS — Z299 Encounter for prophylactic measures, unspecified: Secondary | ICD-10-CM | POA: Diagnosis not present

## 2021-03-07 DIAGNOSIS — I10 Essential (primary) hypertension: Secondary | ICD-10-CM | POA: Diagnosis not present

## 2021-03-07 DIAGNOSIS — D692 Other nonthrombocytopenic purpura: Secondary | ICD-10-CM | POA: Diagnosis not present

## 2021-03-27 DIAGNOSIS — E7800 Pure hypercholesterolemia, unspecified: Secondary | ICD-10-CM | POA: Diagnosis not present

## 2021-03-27 DIAGNOSIS — I10 Essential (primary) hypertension: Secondary | ICD-10-CM | POA: Diagnosis not present

## 2021-04-05 DIAGNOSIS — H5213 Myopia, bilateral: Secondary | ICD-10-CM | POA: Diagnosis not present

## 2021-04-28 DIAGNOSIS — R29818 Other symptoms and signs involving the nervous system: Secondary | ICD-10-CM | POA: Diagnosis not present

## 2021-04-28 DIAGNOSIS — I10 Essential (primary) hypertension: Secondary | ICD-10-CM | POA: Diagnosis not present

## 2021-04-28 DIAGNOSIS — I639 Cerebral infarction, unspecified: Secondary | ICD-10-CM | POA: Diagnosis not present

## 2021-05-02 DIAGNOSIS — I10 Essential (primary) hypertension: Secondary | ICD-10-CM | POA: Diagnosis not present

## 2021-05-02 DIAGNOSIS — Z299 Encounter for prophylactic measures, unspecified: Secondary | ICD-10-CM | POA: Diagnosis not present

## 2021-05-02 DIAGNOSIS — Z720 Tobacco use: Secondary | ICD-10-CM | POA: Diagnosis not present

## 2021-05-02 DIAGNOSIS — E7800 Pure hypercholesterolemia, unspecified: Secondary | ICD-10-CM | POA: Diagnosis not present

## 2021-05-02 DIAGNOSIS — G459 Transient cerebral ischemic attack, unspecified: Secondary | ICD-10-CM | POA: Diagnosis not present

## 2021-05-08 DIAGNOSIS — G459 Transient cerebral ischemic attack, unspecified: Secondary | ICD-10-CM | POA: Diagnosis not present

## 2021-05-15 DIAGNOSIS — G459 Transient cerebral ischemic attack, unspecified: Secondary | ICD-10-CM | POA: Diagnosis not present

## 2021-05-23 ENCOUNTER — Other Ambulatory Visit (HOSPITAL_COMMUNITY): Payer: Self-pay | Admitting: Neurology

## 2021-05-23 DIAGNOSIS — G459 Transient cerebral ischemic attack, unspecified: Secondary | ICD-10-CM

## 2021-05-28 DIAGNOSIS — I10 Essential (primary) hypertension: Secondary | ICD-10-CM | POA: Diagnosis not present

## 2021-05-28 DIAGNOSIS — E7800 Pure hypercholesterolemia, unspecified: Secondary | ICD-10-CM | POA: Diagnosis not present

## 2021-06-07 ENCOUNTER — Ambulatory Visit (HOSPITAL_COMMUNITY)
Admission: RE | Admit: 2021-06-07 | Discharge: 2021-06-07 | Disposition: A | Discharge status: Home / Self Care | Payer: No Typology Code available for payment source | Source: Ambulatory Visit | Attending: Neurology | Admitting: Neurology

## 2021-06-07 ENCOUNTER — Other Ambulatory Visit: Payer: Self-pay

## 2021-06-07 DIAGNOSIS — G459 Transient cerebral ischemic attack, unspecified: Secondary | ICD-10-CM

## 2021-06-12 ENCOUNTER — Ambulatory Visit (HOSPITAL_COMMUNITY)
Admission: RE | Admit: 2021-06-12 | Discharge: 2021-06-12 | Disposition: A | Discharge status: Home / Self Care | Payer: No Typology Code available for payment source | Source: Ambulatory Visit | Attending: Neurology | Admitting: Neurology

## 2021-06-12 ENCOUNTER — Other Ambulatory Visit: Payer: Self-pay

## 2021-06-12 DIAGNOSIS — I6602 Occlusion and stenosis of left middle cerebral artery: Secondary | ICD-10-CM | POA: Diagnosis not present

## 2021-06-12 DIAGNOSIS — Z8673 Personal history of transient ischemic attack (TIA), and cerebral infarction without residual deficits: Secondary | ICD-10-CM | POA: Diagnosis not present

## 2021-06-12 DIAGNOSIS — I672 Cerebral atherosclerosis: Secondary | ICD-10-CM | POA: Diagnosis not present

## 2021-06-12 DIAGNOSIS — G459 Transient cerebral ischemic attack, unspecified: Secondary | ICD-10-CM | POA: Diagnosis present

## 2021-06-12 DIAGNOSIS — I651 Occlusion and stenosis of basilar artery: Secondary | ICD-10-CM | POA: Diagnosis not present

## 2021-06-12 LAB — POCT I-STAT CREATININE: Creatinine, Ser: 0.8 mg/dL (ref 0.61–1.24)

## 2021-06-12 MED ORDER — IOHEXOL 350 MG/ML SOLN
75.0000 mL | Freq: Once | INTRAVENOUS | Status: AC | PRN
  Administered 2021-06-12: 75 mL via INTRAVENOUS

## 2021-07-14 NOTE — Addendum Note (Signed)
Encounter addended by: Novella Olive on: 07/14/2021 11:08 AM  Actions taken: Letter saved

## 2021-07-18 DIAGNOSIS — Z299 Encounter for prophylactic measures, unspecified: Secondary | ICD-10-CM | POA: Diagnosis not present

## 2021-07-18 DIAGNOSIS — I10 Essential (primary) hypertension: Secondary | ICD-10-CM | POA: Diagnosis not present

## 2021-07-18 DIAGNOSIS — Z1211 Encounter for screening for malignant neoplasm of colon: Secondary | ICD-10-CM | POA: Diagnosis not present

## 2021-07-18 DIAGNOSIS — Z23 Encounter for immunization: Secondary | ICD-10-CM | POA: Diagnosis not present

## 2021-07-18 DIAGNOSIS — Z7189 Other specified counseling: Secondary | ICD-10-CM | POA: Diagnosis not present

## 2021-07-18 DIAGNOSIS — Z79899 Other long term (current) drug therapy: Secondary | ICD-10-CM | POA: Diagnosis not present

## 2021-07-18 DIAGNOSIS — R5383 Other fatigue: Secondary | ICD-10-CM | POA: Diagnosis not present

## 2021-07-18 DIAGNOSIS — Z0000 Encounter for general adult medical examination without abnormal findings: Secondary | ICD-10-CM | POA: Diagnosis not present

## 2021-07-18 DIAGNOSIS — E7800 Pure hypercholesterolemia, unspecified: Secondary | ICD-10-CM | POA: Diagnosis not present

## 2021-07-28 DIAGNOSIS — I10 Essential (primary) hypertension: Secondary | ICD-10-CM | POA: Diagnosis not present

## 2021-07-28 DIAGNOSIS — E7800 Pure hypercholesterolemia, unspecified: Secondary | ICD-10-CM | POA: Diagnosis not present

## 2021-08-30 DIAGNOSIS — K760 Fatty (change of) liver, not elsewhere classified: Secondary | ICD-10-CM | POA: Diagnosis not present

## 2021-08-30 DIAGNOSIS — I10 Essential (primary) hypertension: Secondary | ICD-10-CM | POA: Diagnosis not present

## 2021-08-30 DIAGNOSIS — Z299 Encounter for prophylactic measures, unspecified: Secondary | ICD-10-CM | POA: Diagnosis not present

## 2021-08-30 DIAGNOSIS — M533 Sacrococcygeal disorders, not elsewhere classified: Secondary | ICD-10-CM | POA: Diagnosis not present

## 2021-08-30 DIAGNOSIS — M436 Torticollis: Secondary | ICD-10-CM | POA: Diagnosis not present

## 2021-11-10 DIAGNOSIS — Z299 Encounter for prophylactic measures, unspecified: Secondary | ICD-10-CM | POA: Diagnosis not present

## 2021-11-10 DIAGNOSIS — M542 Cervicalgia: Secondary | ICD-10-CM | POA: Diagnosis not present

## 2021-11-10 DIAGNOSIS — Z789 Other specified health status: Secondary | ICD-10-CM | POA: Diagnosis not present

## 2021-11-10 DIAGNOSIS — I2510 Atherosclerotic heart disease of native coronary artery without angina pectoris: Secondary | ICD-10-CM | POA: Diagnosis not present

## 2021-11-10 DIAGNOSIS — I10 Essential (primary) hypertension: Secondary | ICD-10-CM | POA: Diagnosis not present

## 2021-12-18 DIAGNOSIS — Z789 Other specified health status: Secondary | ICD-10-CM | POA: Diagnosis not present

## 2021-12-18 DIAGNOSIS — Z299 Encounter for prophylactic measures, unspecified: Secondary | ICD-10-CM | POA: Diagnosis not present

## 2021-12-18 DIAGNOSIS — J019 Acute sinusitis, unspecified: Secondary | ICD-10-CM | POA: Diagnosis not present

## 2021-12-18 DIAGNOSIS — I10 Essential (primary) hypertension: Secondary | ICD-10-CM | POA: Diagnosis not present

## 2022-03-09 DIAGNOSIS — I10 Essential (primary) hypertension: Secondary | ICD-10-CM | POA: Diagnosis not present

## 2022-03-09 DIAGNOSIS — Z79899 Other long term (current) drug therapy: Secondary | ICD-10-CM | POA: Diagnosis not present

## 2022-03-09 DIAGNOSIS — R5383 Other fatigue: Secondary | ICD-10-CM | POA: Diagnosis not present

## 2022-03-09 DIAGNOSIS — Z0000 Encounter for general adult medical examination without abnormal findings: Secondary | ICD-10-CM | POA: Diagnosis not present

## 2022-03-09 DIAGNOSIS — Z299 Encounter for prophylactic measures, unspecified: Secondary | ICD-10-CM | POA: Diagnosis not present

## 2022-03-09 DIAGNOSIS — E7800 Pure hypercholesterolemia, unspecified: Secondary | ICD-10-CM | POA: Diagnosis not present

## 2022-03-09 DIAGNOSIS — Z7189 Other specified counseling: Secondary | ICD-10-CM | POA: Diagnosis not present

## 2022-05-12 DIAGNOSIS — H40033 Anatomical narrow angle, bilateral: Secondary | ICD-10-CM | POA: Diagnosis not present

## 2022-05-12 DIAGNOSIS — H2513 Age-related nuclear cataract, bilateral: Secondary | ICD-10-CM | POA: Diagnosis not present

## 2022-06-11 DIAGNOSIS — U071 COVID-19: Secondary | ICD-10-CM | POA: Diagnosis not present

## 2022-06-11 DIAGNOSIS — I10 Essential (primary) hypertension: Secondary | ICD-10-CM | POA: Diagnosis not present

## 2022-06-11 DIAGNOSIS — R509 Fever, unspecified: Secondary | ICD-10-CM | POA: Diagnosis not present

## 2022-06-11 DIAGNOSIS — Z299 Encounter for prophylactic measures, unspecified: Secondary | ICD-10-CM | POA: Diagnosis not present

## 2022-06-15 DIAGNOSIS — R5383 Other fatigue: Secondary | ICD-10-CM | POA: Diagnosis not present

## 2022-06-15 DIAGNOSIS — Z299 Encounter for prophylactic measures, unspecified: Secondary | ICD-10-CM | POA: Diagnosis not present

## 2022-06-15 DIAGNOSIS — R21 Rash and other nonspecific skin eruption: Secondary | ICD-10-CM | POA: Diagnosis not present

## 2022-07-20 DIAGNOSIS — M791 Myalgia, unspecified site: Secondary | ICD-10-CM | POA: Diagnosis not present

## 2022-07-20 DIAGNOSIS — N200 Calculus of kidney: Secondary | ICD-10-CM | POA: Diagnosis not present

## 2022-07-20 DIAGNOSIS — R350 Frequency of micturition: Secondary | ICD-10-CM | POA: Diagnosis not present

## 2022-07-20 DIAGNOSIS — Z23 Encounter for immunization: Secondary | ICD-10-CM | POA: Diagnosis not present

## 2022-07-20 DIAGNOSIS — E7800 Pure hypercholesterolemia, unspecified: Secondary | ICD-10-CM | POA: Diagnosis not present

## 2022-07-20 DIAGNOSIS — N201 Calculus of ureter: Secondary | ICD-10-CM | POA: Diagnosis not present

## 2022-07-20 DIAGNOSIS — I10 Essential (primary) hypertension: Secondary | ICD-10-CM | POA: Diagnosis not present

## 2022-07-20 DIAGNOSIS — Z299 Encounter for prophylactic measures, unspecified: Secondary | ICD-10-CM | POA: Diagnosis not present

## 2022-07-20 DIAGNOSIS — Z0000 Encounter for general adult medical examination without abnormal findings: Secondary | ICD-10-CM | POA: Diagnosis not present

## 2022-07-20 DIAGNOSIS — R109 Unspecified abdominal pain: Secondary | ICD-10-CM | POA: Diagnosis not present

## 2022-08-06 DIAGNOSIS — N200 Calculus of kidney: Secondary | ICD-10-CM | POA: Diagnosis not present

## 2022-08-06 DIAGNOSIS — I10 Essential (primary) hypertension: Secondary | ICD-10-CM | POA: Diagnosis not present

## 2022-08-06 DIAGNOSIS — Z299 Encounter for prophylactic measures, unspecified: Secondary | ICD-10-CM | POA: Diagnosis not present

## 2022-08-23 ENCOUNTER — Ambulatory Visit: Payer: Non-veteran care | Admitting: Urology

## 2022-08-24 ENCOUNTER — Encounter: Payer: Self-pay | Admitting: Urology

## 2022-08-24 ENCOUNTER — Ambulatory Visit (HOSPITAL_COMMUNITY)
Admission: RE | Admit: 2022-08-24 | Discharge: 2022-08-24 | Disposition: A | Discharge status: Home / Self Care | Payer: No Typology Code available for payment source | Source: Ambulatory Visit | Attending: Urology | Admitting: Urology

## 2022-08-24 ENCOUNTER — Ambulatory Visit (INDEPENDENT_AMBULATORY_CARE_PROVIDER_SITE_OTHER): Payer: Medicare Other | Admitting: Urology

## 2022-08-24 ENCOUNTER — Other Ambulatory Visit: Payer: Self-pay

## 2022-08-24 VITALS — BP 113/75 | HR 52 | Ht 66.0 in | Wt 162.0 lb

## 2022-08-24 DIAGNOSIS — N201 Calculus of ureter: Secondary | ICD-10-CM

## 2022-08-24 DIAGNOSIS — N200 Calculus of kidney: Secondary | ICD-10-CM | POA: Diagnosis present

## 2022-08-24 DIAGNOSIS — Z87442 Personal history of urinary calculi: Secondary | ICD-10-CM | POA: Diagnosis not present

## 2022-08-24 DIAGNOSIS — Z9049 Acquired absence of other specified parts of digestive tract: Secondary | ICD-10-CM | POA: Diagnosis not present

## 2022-08-24 NOTE — Patient Instructions (Signed)
ESWL for Kidney Stones  Extracorporeal shock wave lithotripsy (ESWL) is a treatment that can help break up kidney stones that are too large to pass on their own.  This is a nonsurgical procedure that breaks up a kidney stone with shock waves. These shock waves pass through your body and focus on the kidney stone. They cause the kidney stone to break into smaller pieces (fragments) while it is still in the urinary tract. The fragments of stone can pass more easily out of your body in the urine. Tell a health care provider about: Any allergies you have. All medicines you are taking, including vitamins, herbs, eye drops, creams, and over-the-counter medicines. Any problems you or family members have had with anesthetic medicines. Any bleeding problems you have. Any surgeries you have had. Any medical conditions you have. Whether you are pregnant or may be pregnant. What are the risks? Your health care provider will talk with you about risks. These may include: Infection. Bleeding from the kidney. Bruising of the kidney or skin. Scarring of the kidney. This can lead to: Increased blood pressure. Poor kidney function. Return (recurrence) of kidney stones. Damage to other structures or organs. This may include the liver, colon, spleen, or pancreas. Blockage (obstruction) of the tube that carries urine from the kidney to the bladder (ureter). Failure of the kidney stone to break into fragments. What happens before the procedure? When to stop eating and drinking Follow instructions from your health care provider about what you may eat and drink. These may include: 8 hours before your procedure Stop eating most foods. Do not eat meat, fried foods, or fatty foods. Eat only light foods, such as toast or crackers. All liquids are okay except energy drinks and alcohol. 6 hours before your procedure Stop eating. Drink only clear liquids, such as water, clear fruit juice, black coffee, plain tea,  and sports drinks. Do not drink energy drinks or alcohol. 2 hours before your procedure Stop drinking all liquids. You may be allowed to take medicines with small sips of water. If you do not follow your health care provider's instructions, your procedure may be delayed or canceled. Medicines Ask your health care provider about: Changing or stopping your regular medicines. These include any diabetes medicines or blood thinners you take. Taking medicines such as aspirin and ibuprofen. These medicines can thin your blood. Do not take them unless your health care provider tells you to. Taking over-the-counter medicines, vitamins, herbs, and supplements. Tests You may have tests, such as: Blood tests. Urine tests. Imaging tests. This may include a CT scan. Surgery safety Ask your health care provider: How your surgery site will be marked. What steps will be taken to help prevent infection. These steps may include: Washing skin with a soap that kills germs. Receiving antibiotics. General instructions If you will be going home right after the procedure, plan to have a responsible adult: Take you home from the hospital or clinic. You will not be allowed to drive. Care for you for the time you are told. What happens during the procedure?  An IV will be inserted into one of your veins. You may be given: A sedative. This helps you relax. Anesthesia. This will: Numb certain areas of your body. Make you fall asleep for surgery. A water-filled cushion may be placed behind your kidney or on your abdomen. In some cases, you may be placed in a tub of lukewarm water. Your body will be positioned in a way that makes it   easier to target the kidney stone. An X-ray or ultrasound exam will be done to locate your stone. Shock waves will be aimed at the stone. If you are awake, you may feel a tapping sensation as the shock waves pass through your body. A small mesh tube (stent) may be placed in your  ureter. This will help keep urine flowing from the kidney if the fragments of the stone have been blocking the ureter. The stent will be removed at a later time by your health care provider. The procedure may vary among health care providers and hospitals. What happens after the procedure? Your blood pressure, heart rate, breathing rate, and blood oxygen level will be monitored until you leave the hospital or clinic. You may have an X-ray after the procedure to see how many of the kidney stones were broken up. This will also show how much of the stone has passed. If there are still large fragments after treatment, you may need to have a second procedure at a later time. This information is not intended to replace advice given to you by your health care provider. Make sure you discuss any questions you have with your health care provider. Document Revised: 02/15/2022 Document Reviewed: 02/15/2022 Elsevier Patient Education  2023 Elsevier Inc.  

## 2022-08-24 NOTE — H&P (View-Only) (Signed)
08/24/2022 12:13 PM   Matthew Ward Jul 13, 1951 [id number]  Referring provider: Medicine, [facility] [address] [place],  [place] [address]  nephrolithiasis   HPI: [name] is a 71yo here for evaluation of nephrolithiasis. He presented to [facility] on 07/20/2022 and was diagnosed with a 64mm right distal ureteral calculus. He has not passed the calculus. He has not had flank pain in 1 week. He denies any LUTS. KUB from today shows a 30mm right distal ureteral calculus.    PMH: Past Medical History:  Diagnosis Date   Allergy to IVP dye    Blood glucose elevated    December, 2011.... needs primary care evaluation   CAD (coronary artery disease)    cardiac catheterization with noncritical disease 2002 /   nuclear scan October 05, 2010... inferior ischemia  /  catheterization October 09, 2010, mild nonobstructive disease, medical therapy...renal arteries not studied, EF 65%   CAD in native artery    EF 65%, catheterization, December, 2011   Dyslipidemia    Intolerant to Statins   GERD (gastroesophageal reflux disease)    Hypertension    pressure was high at [facility]... renal arteriogram not done at cath 09/2010... pressure stabilized with medication adjustments   Tobacco abuse     Surgical History: No past surgical history on file.  Home Medications:  Allergies as of 08/24/2022       Reactions   Elemental Sulfur    Hives   Iodinated Contrast Media    Flushed.Marland KitchenMarland KitchenPremedicate for Cath   Penicillins    REACTION: rash   Sulfa Antibiotics Hives        Medication List        Accurate as of August 24, 2022 12:13 PM. If you have any questions, ask your nurse or doctor.          acetaminophen 325 MG tablet Commonly known as: TYLENOL Take 1-2 tablets by mouth three times a day as needed.   ALPRAZolam 0.5 MG tablet Commonly known as: XANAX Take 1/2 tablet by mouth at bedtime.   amLODipine 5 MG tablet Commonly known as: NORVASC Take 1 tablet by  mouth daily.   aspirin 81 MG tablet Take 1 tablet by mouth daily.   carvedilol 6.25 MG tablet Commonly known as: COREG Take 1 tablet by mouth two times a day.   clopidogrel 75 MG tablet Commonly known as: PLAVIX Take 75 mg by mouth daily.   cyclobenzaprine 10 MG tablet Commonly known as: FLEXERIL Take 1 tablet by mouth three times per day as needed.   finasteride 5 MG tablet Commonly known as: PROSCAR Take by mouth.   gabapentin 300 MG capsule Commonly known as: NEURONTIN Take by mouth.   Gaviscon Extra Relief Formula 160-105 MG Chew Generic drug: Alum Hydroxide-Mag Carbonate Chew 1 tablet by mouth as needed.   nystatin cream Commonly known as: MYCOSTATIN Apply topically to elbows daily.   omeprazole 20 MG tablet Commonly known as: PRILOSEC OTC Take 1 tablet by mouth daily.   pantoprazole 40 MG tablet Commonly known as: PROTONIX Continuous.   valsartan 160 MG tablet Commonly known as: DIOVAN Take 1 tablet by mouth daily.        Allergies:  Allergies  Allergen Reactions   Elemental Sulfur     Hives   Iodinated Contrast Media     Flushed.Marland KitchenMarland KitchenPremedicate for Cath   Penicillins     REACTION: rash   Sulfa Antibiotics Hives    Family History: Family History  Problem  Relation Age of Onset   Coronary artery disease Neg Hx     Social History:  reports that he has quit smoking. His smokeless tobacco use includes chew. He reports that he does not drink alcohol and does not use drugs.  ROS: All other review of systems were reviewed and are negative except what is noted above in HPI  Physical Exam: BP 113/75   Pulse (!) 52   Ht 5' 6" (1.676 m)   Wt 162 lb (73.5 kg)   BMI 26.15 kg/m   Constitutional:  Alert and oriented, No acute distress. HEENT: [place] AT, moist mucus membranes.  Trachea midline, no masses. Cardiovascular: No clubbing, cyanosis, or edema. Respiratory: Normal respiratory effort, no increased work of breathing. GI: Abdomen is soft,  nontender, nondistended, no abdominal masses GU: No CVA tenderness.  Lymph: No cervical or inguinal lymphadenopathy. Skin: No rashes, bruises or suspicious lesions. Neurologic: Grossly intact, no focal deficits, moving all 4 extremities. Psychiatric: Normal mood and affect.  Laboratory Data: Lab Results  Component Value Date   WBC 6.3 04/20/2020   HGB 13.4 04/20/2020   HCT 40.1 04/20/2020   MCV 86.2 04/20/2020   PLT 256 04/20/2020    Lab Results  Component Value Date   CREATININE 0.80 06/12/2021    No results found for: "PSA"  No results found for: "TESTOSTERONE"  No results found for: "HGBA1C"  Urinalysis No results found for: "COLORURINE", "APPEARANCEUR", "LABSPEC", "PHURINE", "GLUCOSEU", "HGBUR", "BILIRUBINUR", "KETONESUR", "PROTEINUR", "UROBILINOGEN", "NITRITE", "LEUKOCYTESUR"  No results found for: "LABMICR", "WBCUA", "RBCUA", "LABEPIT", "MUCUS", "BACTERIA"  Pertinent Imaging: CT 07/20/2022 and KUB today: Images reviewed and discussed with the patient  No results found for this or any previous visit.  No results found for this or any previous visit.  No results found for this or any previous visit.  No results found for this or any previous visit.  No results found for this or any previous visit.  No valid procedures specified. No results found for this or any previous visit.  No results found for this or any previous visit.   Assessment & Plan:    1. Kidney stones -We discussed the management of kidney stones. These options include observation, ureteroscopy, shockwave lithotripsy (ESWL) and percutaneous nephrolithotomy (PCNL). We discussed which options are relevant to the patient's stone(s). We discussed the natural history of kidney stones as well as the complications of untreated stones and the impact on quality of life without treatment as well as with each of the above listed treatments. We also discussed the efficacy of each treatment in its ability to  clear the stone burden. With any of these management options I discussed the signs and symptoms of infection and the need for emergent treatment should these be experienced. For each option we discussed the ability of each procedure to clear the patient of their stone burden.   For observation I described the risks which include but are not limited to silent renal damage, life-threatening infection, need for emergent surgery, failure to pass stone and pain.   For ureteroscopy I described the risks which include bleeding, infection, damage to contiguous structures, positioning injury, ureteral stricture, ureteral avulsion, ureteral injury, need for prolonged ureteral stent, inability to perform ureteroscopy, need for an interval procedure, inability to clear stone burden, stent discomfort/pain, heart attack, stroke, pulmonary embolus and the inherent risks with general anesthesia.   For shockwave lithotripsy I described the risks which include arrhythmia, kidney contusion, kidney hemorrhage, need for transfusion, pain, inability to adequately   break up stone, inability to pass stone fragments, Steinstrasse, infection associated with obstructing stones, need for alternate surgical procedure, need for repeat shockwave lithotripsy, MI, CVA, PE and the inherent risks with anesthesia/conscious sedation.   For PCNL I described the risks including positioning injury, pneumothorax, hydrothorax, need for chest tube, inability to clear stone burden, renal laceration, arterial venous fistula or malformation, need for embolization of kidney, loss of kidney or renal function, need for repeat procedure, need for prolonged nephrostomy tube, ureteral avulsion, MI, CVA, PE and the inherent risks of general anesthesia.   - The patient would like to proceed with Right ESWL - Urinalysis, Routine w reflex microscopic - Abdomen 1 view (KUB)   No follow-ups on file.  Augie Vane, MD  [facility] [place]    

## 2022-08-24 NOTE — Progress Notes (Signed)
08/24/2022 12:13 PM   Matthew Ward Jul 13, 1951 [id number]  Referring provider: Medicine, [facility] [address] [place],  [place] [address]  nephrolithiasis   HPI: [name] is a 71yo here for evaluation of nephrolithiasis. He presented to [facility] on 07/20/2022 and was diagnosed with a 64mm right distal ureteral calculus. He has not passed the calculus. He has not had flank pain in 1 week. He denies any LUTS. KUB from today shows a 30mm right distal ureteral calculus.    PMH: Past Medical History:  Diagnosis Date   Allergy to IVP dye    Blood glucose elevated    December, 2011.... needs primary care evaluation   CAD (coronary artery disease)    cardiac catheterization with noncritical disease 2002 /   nuclear scan October 05, 2010... inferior ischemia  /  catheterization October 09, 2010, mild nonobstructive disease, medical therapy...renal arteries not studied, EF 65%   CAD in native artery    EF 65%, catheterization, December, 2011   Dyslipidemia    Intolerant to Statins   GERD (gastroesophageal reflux disease)    Hypertension    pressure was high at [facility]... renal arteriogram not done at cath 09/2010... pressure stabilized with medication adjustments   Tobacco abuse     Surgical History: No past surgical history on file.  Home Medications:  Allergies as of 08/24/2022       Reactions   Elemental Sulfur    Hives   Iodinated Contrast Media    Flushed.Marland KitchenMarland KitchenPremedicate for Cath   Penicillins    REACTION: rash   Sulfa Antibiotics Hives        Medication List        Accurate as of August 24, 2022 12:13 PM. If you have any questions, ask your nurse or doctor.          acetaminophen 325 MG tablet Commonly known as: TYLENOL Take 1-2 tablets by mouth three times a day as needed.   ALPRAZolam 0.5 MG tablet Commonly known as: XANAX Take 1/2 tablet by mouth at bedtime.   amLODipine 5 MG tablet Commonly known as: NORVASC Take 1 tablet by  mouth daily.   aspirin 81 MG tablet Take 1 tablet by mouth daily.   carvedilol 6.25 MG tablet Commonly known as: COREG Take 1 tablet by mouth two times a day.   clopidogrel 75 MG tablet Commonly known as: PLAVIX Take 75 mg by mouth daily.   cyclobenzaprine 10 MG tablet Commonly known as: FLEXERIL Take 1 tablet by mouth three times per day as needed.   finasteride 5 MG tablet Commonly known as: PROSCAR Take by mouth.   gabapentin 300 MG capsule Commonly known as: NEURONTIN Take by mouth.   Gaviscon Extra Relief Formula 160-105 MG Chew Generic drug: Alum Hydroxide-Mag Carbonate Chew 1 tablet by mouth as needed.   nystatin cream Commonly known as: MYCOSTATIN Apply topically to elbows daily.   omeprazole 20 MG tablet Commonly known as: PRILOSEC OTC Take 1 tablet by mouth daily.   pantoprazole 40 MG tablet Commonly known as: PROTONIX Continuous.   valsartan 160 MG tablet Commonly known as: DIOVAN Take 1 tablet by mouth daily.        Allergies:  Allergies  Allergen Reactions   Elemental Sulfur     Hives   Iodinated Contrast Media     Flushed.Marland KitchenMarland KitchenPremedicate for Cath   Penicillins     REACTION: rash   Sulfa Antibiotics Hives    Family History: Family History  Problem  Relation Age of Onset   Coronary artery disease Neg Hx     Social History:  reports that he has quit smoking. His smokeless tobacco use includes chew. He reports that he does not drink alcohol and does not use drugs.  ROS: All other review of systems were reviewed and are negative except what is noted above in HPI  Physical Exam: BP 113/75   Pulse (!) 52   Ht 5\' 6"  (1.676 m)   Wt 162 lb (73.5 kg)   BMI 26.15 kg/m   Constitutional:  Alert and oriented, No acute distress. HEENT: [place] AT, moist mucus membranes.  Trachea midline, no masses. Cardiovascular: No clubbing, cyanosis, or edema. Respiratory: Normal respiratory effort, no increased work of breathing. GI: Abdomen is soft,  nontender, nondistended, no abdominal masses GU: No CVA tenderness.  Lymph: No cervical or inguinal lymphadenopathy. Skin: No rashes, bruises or suspicious lesions. Neurologic: Grossly intact, no focal deficits, moving all 4 extremities. Psychiatric: Normal mood and affect.  Laboratory Data: Lab Results  Component Value Date   WBC 6.3 04/20/2020   HGB 13.4 04/20/2020   HCT 40.1 04/20/2020   MCV 86.2 04/20/2020   PLT 256 04/20/2020    Lab Results  Component Value Date   CREATININE 0.80 06/12/2021    No results found for: "PSA"  No results found for: "TESTOSTERONE"  No results found for: "HGBA1C"  Urinalysis No results found for: "COLORURINE", "APPEARANCEUR", "LABSPEC", "PHURINE", "GLUCOSEU", "HGBUR", "BILIRUBINUR", "KETONESUR", "PROTEINUR", "UROBILINOGEN", "NITRITE", "LEUKOCYTESUR"  No results found for: "LABMICR", "WBCUA", "RBCUA", "LABEPIT", "MUCUS", "BACTERIA"  Pertinent Imaging: CT 07/20/2022 and KUB today: Images reviewed and discussed with the patient  No results found for this or any previous visit.  No results found for this or any previous visit.  No results found for this or any previous visit.  No results found for this or any previous visit.  No results found for this or any previous visit.  No valid procedures specified. No results found for this or any previous visit.  No results found for this or any previous visit.   Assessment & Plan:    1. Kidney stones -We discussed the management of kidney stones. These options include observation, ureteroscopy, shockwave lithotripsy (ESWL) and percutaneous nephrolithotomy (PCNL). We discussed which options are relevant to the patient's stone(s). We discussed the natural history of kidney stones as well as the complications of untreated stones and the impact on quality of life without treatment as well as with each of the above listed treatments. We also discussed the efficacy of each treatment in its ability to  clear the stone burden. With any of these management options I discussed the signs and symptoms of infection and the need for emergent treatment should these be experienced. For each option we discussed the ability of each procedure to clear the patient of their stone burden.   For observation I described the risks which include but are not limited to silent renal damage, life-threatening infection, need for emergent surgery, failure to pass stone and pain.   For ureteroscopy I described the risks which include bleeding, infection, damage to contiguous structures, positioning injury, ureteral stricture, ureteral avulsion, ureteral injury, need for prolonged ureteral stent, inability to perform ureteroscopy, need for an interval procedure, inability to clear stone burden, stent discomfort/pain, heart attack, stroke, pulmonary embolus and the inherent risks with general anesthesia.   For shockwave lithotripsy I described the risks which include arrhythmia, kidney contusion, kidney hemorrhage, need for transfusion, pain, inability to adequately  break up stone, inability to pass stone fragments, Steinstrasse, infection associated with obstructing stones, need for alternate surgical procedure, need for repeat shockwave lithotripsy, MI, CVA, PE and the inherent risks with anesthesia/conscious sedation.   For PCNL I described the risks including positioning injury, pneumothorax, hydrothorax, need for chest tube, inability to clear stone burden, renal laceration, arterial venous fistula or malformation, need for embolization of kidney, loss of kidney or renal function, need for repeat procedure, need for prolonged nephrostomy tube, ureteral avulsion, MI, CVA, PE and the inherent risks of general anesthesia.   - The patient would like to proceed with Right ESWL - Urinalysis, Routine w reflex microscopic - Abdomen 1 view (KUB)   No follow-ups on file.  Wilkie Aye, MD  [facility]

## 2022-08-27 ENCOUNTER — Inpatient Hospital Stay (HOSPITAL_COMMUNITY)
Admission: RE | Admit: 2022-08-27 | Discharge: 2022-08-27 | Disposition: A | Discharge status: Home / Self Care | Payer: Non-veteran care | Source: Ambulatory Visit

## 2022-08-27 ENCOUNTER — Encounter (HOSPITAL_COMMUNITY): Payer: Self-pay

## 2022-08-27 HISTORY — DX: Cerebral infarction, unspecified: I63.9

## 2022-08-28 ENCOUNTER — Encounter (HOSPITAL_COMMUNITY)
Admission: RE | Disposition: A | Discharge status: Home / Self Care | Payer: Self-pay | Source: Home / Self Care | Attending: Urology

## 2022-08-28 ENCOUNTER — Encounter (HOSPITAL_COMMUNITY): Payer: Self-pay | Admitting: Urology

## 2022-08-28 ENCOUNTER — Ambulatory Visit (HOSPITAL_COMMUNITY)
Admission: RE | Admit: 2022-08-28 | Discharge: 2022-08-28 | Disposition: A | Discharge status: Home / Self Care | Payer: No Typology Code available for payment source | Attending: Urology | Admitting: Urology

## 2022-08-28 DIAGNOSIS — I10 Essential (primary) hypertension: Secondary | ICD-10-CM | POA: Insufficient documentation

## 2022-08-28 DIAGNOSIS — Z8673 Personal history of transient ischemic attack (TIA), and cerebral infarction without residual deficits: Secondary | ICD-10-CM | POA: Insufficient documentation

## 2022-08-28 DIAGNOSIS — N201 Calculus of ureter: Secondary | ICD-10-CM | POA: Diagnosis present

## 2022-08-28 HISTORY — PX: EXTRACORPOREAL SHOCK WAVE LITHOTRIPSY: SHX1557

## 2022-08-28 SURGERY — LITHOTRIPSY, ESWL
Anesthesia: LOCAL | Laterality: Right

## 2022-08-28 MED ORDER — ONDANSETRON HCL 4 MG PO TABS: 4.0000 mg | ORAL_TABLET | Freq: Every day | ORAL | 1 refills | Status: AC | PRN

## 2022-08-28 MED ORDER — ALFUZOSIN HCL ER 10 MG PO TB24: 10.0000 mg | ORAL_TABLET | Freq: Every day | ORAL | 1 refills | Status: AC

## 2022-08-28 MED ORDER — DIPHENHYDRAMINE HCL 25 MG PO CAPS: 25.0000 mg | ORAL_CAPSULE | ORAL | Status: DC

## 2022-08-28 MED ORDER — OXYCODONE-ACETAMINOPHEN 5-325 MG PO TABS: 1.0000 | ORAL_TABLET | ORAL | 0 refills | Status: AC | PRN

## 2022-08-28 MED ORDER — DIAZEPAM 5 MG PO TABS: 10.0000 mg | ORAL_TABLET | ORAL | Status: DC

## 2022-08-28 NOTE — Progress Notes (Signed)
Pt still unable to urinate. Spoke with Dr.Mckenzie and was told to bladder scan pt. Bladder scan showed 111 ml. Per Dr.McKenzie if under 567ml pt can be discharged home. If pt has any concerns call office.

## 2022-08-28 NOTE — Interval H&P Note (Signed)
History and Physical Interval Note:  08/28/2022 10:19 AM  Matthew Ward  has presented today for surgery, with the diagnosis of right ureteral calculus.  The various methods of treatment have been discussed with the patient and family. After consideration of risks, benefits and other options for treatment, the patient has consented to  Procedure(s): EXTRACORPOREAL SHOCK WAVE LITHOTRIPSY (ESWL) (Right) as a surgical intervention.  The patient's history has been reviewed, patient examined, no change in status, stable for surgery.  I have reviewed the patient's chart and labs.  Questions were answered to the patient's satisfaction.     Nicolette Bang

## 2022-08-30 ENCOUNTER — Encounter (HOSPITAL_COMMUNITY): Payer: Self-pay | Admitting: Urology

## 2022-09-13 ENCOUNTER — Ambulatory Visit (HOSPITAL_COMMUNITY)
Admission: RE | Admit: 2022-09-13 | Discharge: 2022-09-13 | Disposition: A | Discharge status: Home / Self Care | Payer: No Typology Code available for payment source | Source: Ambulatory Visit | Attending: Physician Assistant | Admitting: Physician Assistant

## 2022-09-13 ENCOUNTER — Ambulatory Visit (INDEPENDENT_AMBULATORY_CARE_PROVIDER_SITE_OTHER): Payer: Medicare Other | Admitting: Physician Assistant

## 2022-09-13 VITALS — BP 99/66 | HR 61 | Ht 66.0 in | Wt 162.0 lb

## 2022-09-13 DIAGNOSIS — N200 Calculus of kidney: Secondary | ICD-10-CM | POA: Diagnosis present

## 2022-09-13 DIAGNOSIS — Z9889 Other specified postprocedural states: Secondary | ICD-10-CM | POA: Diagnosis not present

## 2022-09-13 DIAGNOSIS — I878 Other specified disorders of veins: Secondary | ICD-10-CM | POA: Diagnosis not present

## 2022-09-13 DIAGNOSIS — N201 Calculus of ureter: Secondary | ICD-10-CM

## 2022-09-13 NOTE — Progress Notes (Signed)
Assessment: 1. Kidney stones  2. Right ureteral stone    Plan: Stone prevention again discussed. Maintain adequate hydration. FU in 6 weeks after Renal US. Call or return if pt has concerns prior to next OV. He will bring stone fragments to next visit if he catches any. Continue Flomax until completed Rx  Chief Complaint: No chief complaint on file.   HPI: Matthew Ward is a 71 y.o. male who presents for post op evaluation of ESWL on R UVJ stone performed on 10/31. Pt passing dust only, no fragments. No gross hematuria. Pt denies pain.   UA= pt voided prior to arrival. No LUTs today KUB- R UVJ stone no longer obvious as compared to previous films   Allergies: Allergies  Allergen Reactions   Elemental Sulfur     Hives   Iodinated Contrast Media     Flushed.Marland KitchenMarland KitchenPremedicate for Cath   Penicillins     REACTION: rash   Sulfa Antibiotics Hives    PMH: Past Medical History:  Diagnosis Date   Allergy to IVP dye    Blood glucose elevated    December, 2011.... needs primary care evaluation   CAD (coronary artery disease)    cardiac catheterization with noncritical disease 2002 /   nuclear scan October 05, 2010... inferior ischemia  /  catheterization October 09, 2010, mild nonobstructive disease, medical therapy...renal arteries not studied, EF 65%   CAD in native artery    EF 65%, catheterization, December, 2011   Dyslipidemia    Intolerant to Statins   GERD (gastroesophageal reflux disease)    Hypertension    pressure was high at [facility]... renal arteriogram not done at cath 09/2010... pressure stabilized with medication adjustments   Stroke (HCC)    mini strokes x 3   Tobacco abuse     PSH: Past Surgical History:  Procedure Laterality Date   CHOLECYSTECTOMY     EXTRACORPOREAL SHOCK WAVE LITHOTRIPSY Right 08/28/2022   Procedure: EXTRACORPOREAL SHOCK WAVE LITHOTRIPSY (ESWL);  Surgeon: Malen Gauze, MD;  Location: [facility];  Service: Urology;   Laterality: Right;   ROTATOR CUFF REPAIR      SH: Social History   Tobacco Use   Smoking status: Former   Smokeless tobacco: Current    Types: Chew   Tobacco comments:    Chews 2 pks per week.  Substance Use Topics   Alcohol use: Never   Drug use: Never    ROS: All other review of systems were reviewed and are negative except what is noted above in HPI  PE: BP 99/66   Pulse 61   Ht 5\' 6"  (1.676 m)   Wt 162 lb (73.5 kg)   BMI 26.15 kg/m  GENERAL APPEARANCE:  Well appearing, well developed, NAD HEENT:  Atraumatic, normocephalic NECK:  Supple. Trachea midline ABDOMEN:  Soft, non-tender, no masses, no CVAT EXTREMITIES:  Moves all extremities well NEUROLOGIC:  Alert and oriented x 3 MENTAL STATUS:  appropriate SKIN:  Warm, dry, and intact   Results: Laboratory Data: Lab Results  Component Value Date   WBC 6.3 04/20/2020   HGB 13.4 04/20/2020   HCT 40.1 04/20/2020   MCV 86.2 04/20/2020   PLT 256 04/20/2020    Lab Results  Component Value Date   CREATININE 0.80 06/12/2021    No results found for: "PSA"  No results found for: "TESTOSTERONE"  No results found for: "HGBA1C"  Urinalysis No results found for: "COLORURINE", "APPEARANCEUR", "LABSPEC", "PHURINE", "GLUCOSEU", "HGBUR", "BILIRUBINUR", "KETONESUR", "PROTEINUR", "UROBILINOGEN", "  NITRITE", "LEUKOCYTESUR"  No results found for: "LABMICR", "WBCUA", "RBCUA", "LABEPIT", "MUCUS", "BACTERIA"  Pertinent Imaging: Results for orders placed in visit on 08/24/22  Abdomen 1 view (KUB)  Narrative CLINICAL DATA:  Ureteral calculus, history of renal calculi.  EXAM: ABDOMEN - 1 VIEW  COMPARISON:  07/20/2022  FINDINGS: Bowel gas pattern unremarkable.  Clips from prior cholecystectomy.  The patient previously had a distal ureteral/UVJ calculus on the right on 07/20/2022. I do not observe a corresponding calcification on today's exam although there are some potentially obscuring loops of bowel. Known  vascular calcification along the right lower anatomic pelvis.  IMPRESSION: 1. The distal right ureteral/UVJ calculus seen on 07/20/2022 is not visible on today's exam, although there are some potentially obscuring loops of bowel. 2. Prior cholecystectomy.   Electronically Signed By: Gaylyn Rong M.D. On: 08/24/2022 12:39  No results found for this or any previous visit.  No results found for this or any previous visit.  No results found for this or any previous visit.  No results found for this or any previous visit.  No valid procedures specified. No results found for this or any previous visit.  No results found for this or any previous visit.  No results found for this or any previous visit (from the past 24 hour(s)).

## 2022-10-17 ENCOUNTER — Telehealth: Payer: Self-pay

## 2022-10-18 ENCOUNTER — Ambulatory Visit: Payer: Non-veteran care | Admitting: Urology

## 2022-10-19 ENCOUNTER — Ambulatory Visit: Payer: Non-veteran care | Admitting: Urology

## 2022-10-23 ENCOUNTER — Ambulatory Visit (HOSPITAL_COMMUNITY): Payer: Medicare Other

## 2022-10-25 NOTE — Telephone Encounter (Signed)
Pt left a voice message 10-23-2022.  Needing to resche

## 2023-01-24 ENCOUNTER — Telehealth: Payer: Self-pay

## 2023-01-24 NOTE — Telephone Encounter (Signed)
I contacted patient and left a vm advising patient to contact our office and advise if he wished to continue care with our office. Patients [facility] authorization expires 01/30/23. We will need to resubmit a authorization request for the next 6 months if he wished to continue care with our facility. Patient was last seen 09/13/22.

## 2023-04-12 DIAGNOSIS — I10 Essential (primary) hypertension: Secondary | ICD-10-CM | POA: Diagnosis not present

## 2023-04-12 DIAGNOSIS — Z0000 Encounter for general adult medical examination without abnormal findings: Secondary | ICD-10-CM | POA: Diagnosis not present

## 2023-04-12 DIAGNOSIS — Z299 Encounter for prophylactic measures, unspecified: Secondary | ICD-10-CM | POA: Diagnosis not present

## 2023-04-12 DIAGNOSIS — D692 Other nonthrombocytopenic purpura: Secondary | ICD-10-CM | POA: Diagnosis not present

## 2023-04-12 DIAGNOSIS — Z1211 Encounter for screening for malignant neoplasm of colon: Secondary | ICD-10-CM | POA: Diagnosis not present

## 2023-04-12 DIAGNOSIS — Z7189 Other specified counseling: Secondary | ICD-10-CM | POA: Diagnosis not present

## 2023-05-24 IMAGING — CT CT ANGIO NECK
1 of 11 series · 5 of 33 positions shown · IV contrast (omnipaque)
Comparison: MRI of the brain April 28, 2021

CLINICAL DATA: Transient cerebral ischemia, unspecified type

EXAM:
CT ANGIOGRAPHY HEAD AND NECK
TECHNIQUE: Multidetector CT imaging of the head and neck was performed using
the standard protocol during bolus administration of intravenous
contrast. Multiplanar CT image reconstructions and MIPs were
obtained to evaluate the vascular anatomy. Carotid stenosis
measurements (when applicable) are obtained utilizing NASCET
criteria, using the distal internal carotid diameter as the
denominator.
CONTRAST:  75mL OMNIPAQUE IOHEXOL 350 MG/ML SOLN

[Series 11: ax thin · axial · 0.39mm/px · z∈[+1290,+1530]mm · 5 of 362 slices shown]
[im 61/362  soft-tissue]
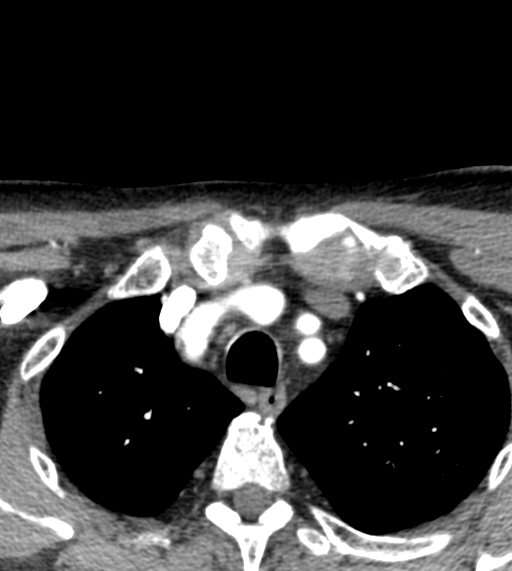
[im 121/362  bone]
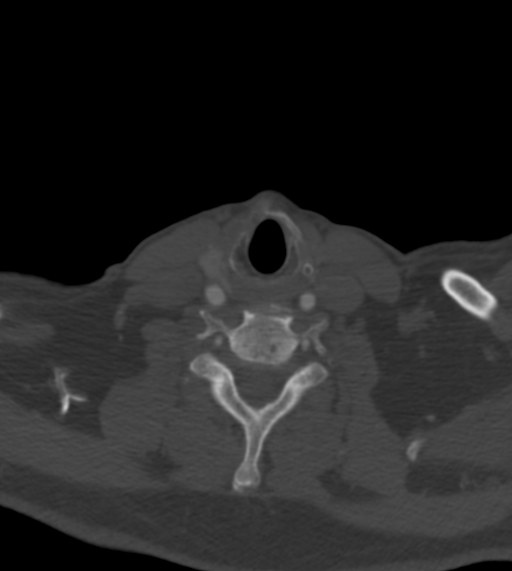
[im 181/362  soft-tissue]
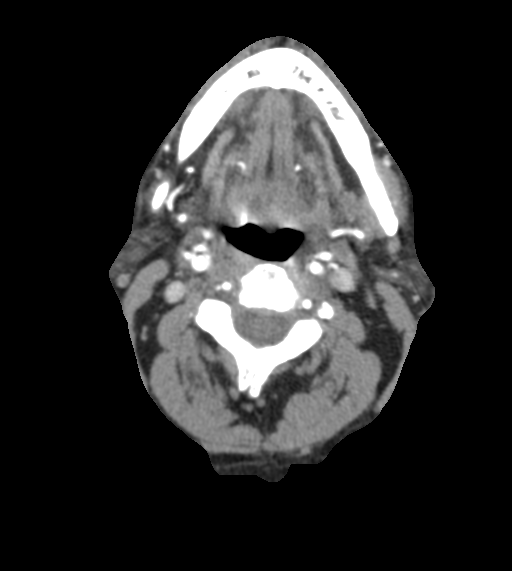
[im 241/362  bone]
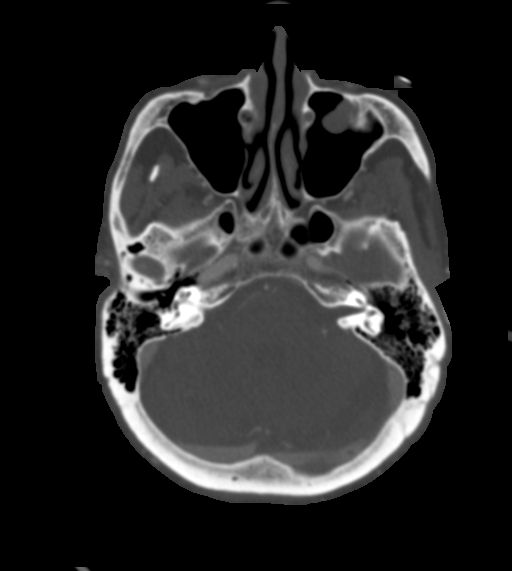
[im 301/362  soft-tissue]
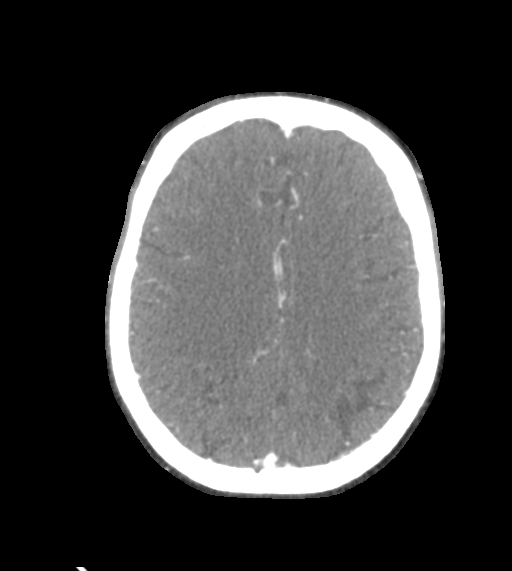

[5 of 33 positions shown; findings below may reference images not displayed]

FINDINGS: CT HEAD FINDINGS

Brain: No evidence of acute infarction, hemorrhage, hydrocephalus,
extra-axial collection or mass lesion/mass effect.

Vascular: No hyperdense vessel. Calcified plaques in the bilateral
carotid siphons.

Skull: Normal. Negative for fracture or focal lesion.

Sinuses: Mild scattered mucosal thickening throughout the paranasal
sinuses.

Orbits: No acute finding.

Review of the MIP images confirms the above findings

CTA NECK FINDINGS

Aortic arch: Common origin of the innominate and left common carotid
artery from the aortic arch. Imaged portion shows no evidence of
aneurysm or dissection. Mild atherosclerotic changes with calcified
plaques in the aortic arch. No significant stenosis of the major
arch vessel origins.

Right carotid system: Mild atherosclerotic changes of the right
carotid bifurcation without hemodynamically significant stenosis.

Left carotid system: Mild atherosclerotic changes of the left
carotid bifurcation without hemodynamically significant stenosis.

Vertebral arteries: Left dominant. Mild atherosclerotic changes at
the origin of the right vertebral artery without hemodynamically
significant stenosis. Mild smooth decrease in caliber of the right
vertebral artery at the distal V2 segment as well as distal to the
right PICA takeoff. Increased tortuosity at the origin of the left
vertebral artery without hemodynamically significant stenosis.

Skeleton: Degenerative changes of the cervical spine. No acute
findings.

Other neck: Negative.

Upper chest: Prominent paratracheal and subaortic lymph nodes.

Review of the MIP images confirms the above findings

CTA HEAD FINDINGS

Anterior circulation: Calcified atherosclerotic plaques in the
bilateral carotid siphons resulting in mild stenosis of the
supraclinoid segment on the right and distal cavernous segment on
the left. Bilateral fetal PCA noted. Hypoplastic/aplastic right
A1/ACA with dominant right A1/ACA segment supplying both A2
segments. Mild luminal irregularities are noted along the bilateral
ACA and MCA vascular trees suggesting intracranial atherosclerotic
disease. Focal area of moderate stenosis at the origin of a left
M3/MCA superior division branch and at the A4 segment of the right
ACA.

Posterior circulation: Hypoplastic vertebrobasilar system is noted
bilateral fetal PCAs. The right vertebral artery is hypoplastic
distal to the right PICA takeoff. There is a focal area of severe
stenosis at the tonsillomedullary segment of the right PICA as well
as severe stenosis of the right vertebral artery near the
vertebrobasilar junction. Atherosclerotic plaque is seen at the V4
segment of the left vertebral artery without hemodynamically
significant stenosis. The entire vertebral artery is diminutive in
caliber with focal area of moderate stenosis in the mid basilar
segment. The P1 segment are severely hypoplastic. Luminal
irregularity seen along the bilateral posterior cerebral arteries
with multifocal areas of mild stenoses bilaterally.

Venous sinuses: As permitted by contrast timing, patent.

Anatomic variants: Hypoplastic vertebrobasilar system with bilateral
fetal PCA. Hypoplastic left A1/ACA.

Review of the MIP images confirms the above findings
IMPRESSION: 1. Intracranial atherosclerotic disease superimposed on a
hypoplastic vertebrobasilar system resulting in diminutive caliber
of the basilar artery with focal area of moderate stenosis in the
mid basilar segment.
2. Intracranial atherosclerotic disease in the anterior circulation
with focal moderate stenosis at a left M3/MCA branch and right
A4/ACA branch.
3. Mild atherosclerotic changes of the bilateral carotid bifurcation
without hemodynamically significant stenosis.
4. Prominent paratracheal and subaortic lymph nodes. Correlation
with dedicated chest CT suggested.

## 2023-05-24 IMAGING — CT CT ANGIO HEAD
1 of 11 series · 5 of 33 positions shown · IV contrast (omnipaque)
Comparison: MRI of the brain April 28, 2021

CLINICAL DATA: Transient cerebral ischemia, unspecified type

EXAM:
CT ANGIOGRAPHY HEAD AND NECK
TECHNIQUE: Multidetector CT imaging of the head and neck was performed using
the standard protocol during bolus administration of intravenous
contrast. Multiplanar CT image reconstructions and MIPs were
obtained to evaluate the vascular anatomy. Carotid stenosis
measurements (when applicable) are obtained utilizing NASCET
criteria, using the distal internal carotid diameter as the
denominator.
CONTRAST:  75mL OMNIPAQUE IOHEXOL 350 MG/ML SOLN

[Series 11: ax thin · axial · 0.39mm/px · z∈[+1290,+1530]mm · 5 of 362 slices shown]
[im 61/362  soft-tissue]
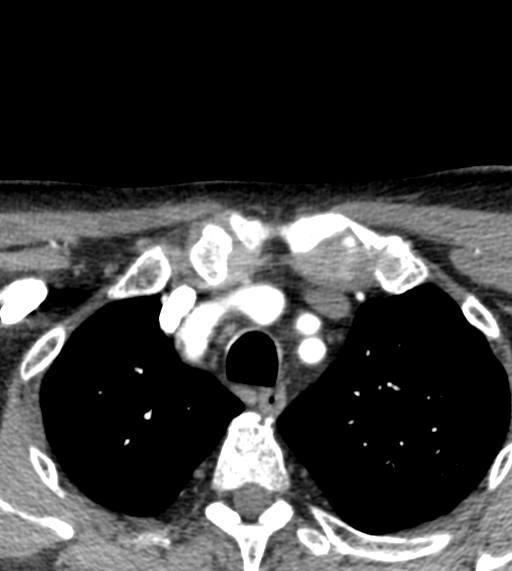
[im 121/362  bone]
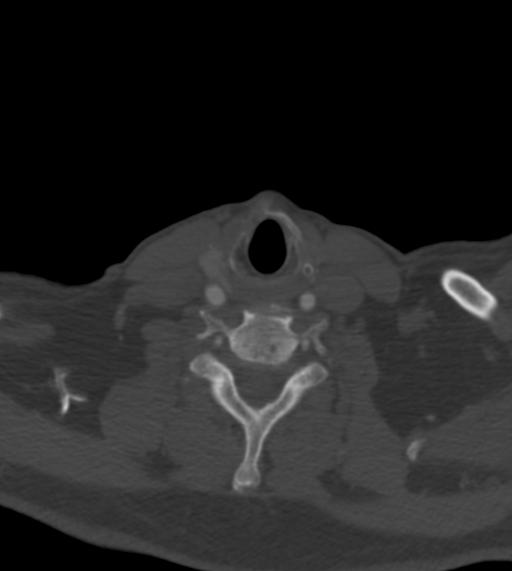
[im 181/362  soft-tissue]
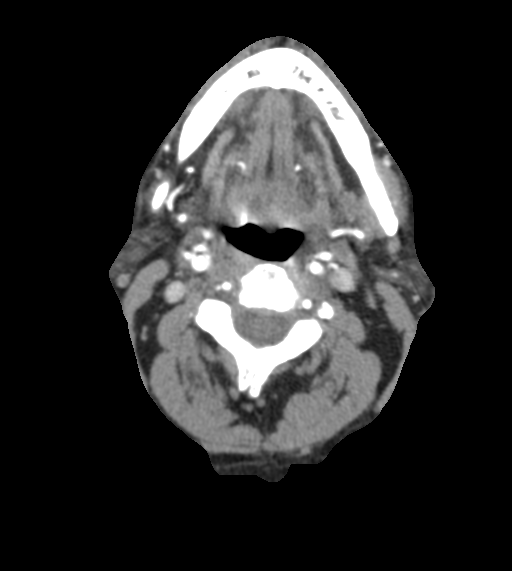
[im 241/362  bone]
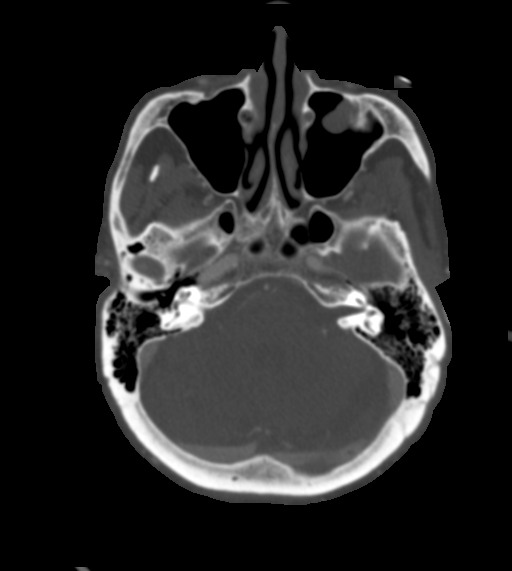
[im 301/362  soft-tissue]
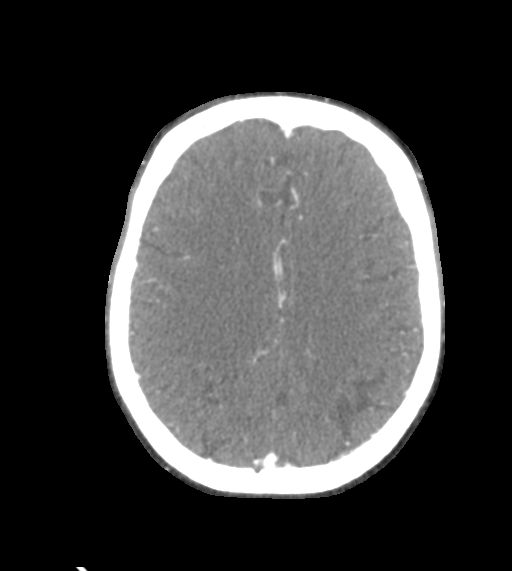

[5 of 33 positions shown; findings below may reference images not displayed]

FINDINGS: CT HEAD FINDINGS

Brain: No evidence of acute infarction, hemorrhage, hydrocephalus,
extra-axial collection or mass lesion/mass effect.

Vascular: No hyperdense vessel. Calcified plaques in the bilateral
carotid siphons.

Skull: Normal. Negative for fracture or focal lesion.

Sinuses: Mild scattered mucosal thickening throughout the paranasal
sinuses.

Orbits: No acute finding.

Review of the MIP images confirms the above findings

CTA NECK FINDINGS

Aortic arch: Common origin of the innominate and left common carotid
artery from the aortic arch. Imaged portion shows no evidence of
aneurysm or dissection. Mild atherosclerotic changes with calcified
plaques in the aortic arch. No significant stenosis of the major
arch vessel origins.

Right carotid system: Mild atherosclerotic changes of the right
carotid bifurcation without hemodynamically significant stenosis.

Left carotid system: Mild atherosclerotic changes of the left
carotid bifurcation without hemodynamically significant stenosis.

Vertebral arteries: Left dominant. Mild atherosclerotic changes at
the origin of the right vertebral artery without hemodynamically
significant stenosis. Mild smooth decrease in caliber of the right
vertebral artery at the distal V2 segment as well as distal to the
right PICA takeoff. Increased tortuosity at the origin of the left
vertebral artery without hemodynamically significant stenosis.

Skeleton: Degenerative changes of the cervical spine. No acute
findings.

Other neck: Negative.

Upper chest: Prominent paratracheal and subaortic lymph nodes.

Review of the MIP images confirms the above findings

CTA HEAD FINDINGS

Anterior circulation: Calcified atherosclerotic plaques in the
bilateral carotid siphons resulting in mild stenosis of the
supraclinoid segment on the right and distal cavernous segment on
the left. Bilateral fetal PCA noted. Hypoplastic/aplastic right
A1/ACA with dominant right A1/ACA segment supplying both A2
segments. Mild luminal irregularities are noted along the bilateral
ACA and MCA vascular trees suggesting intracranial atherosclerotic
disease. Focal area of moderate stenosis at the origin of a left
M3/MCA superior division branch and at the A4 segment of the right
ACA.

Posterior circulation: Hypoplastic vertebrobasilar system is noted
bilateral fetal PCAs. The right vertebral artery is hypoplastic
distal to the right PICA takeoff. There is a focal area of severe
stenosis at the tonsillomedullary segment of the right PICA as well
as severe stenosis of the right vertebral artery near the
vertebrobasilar junction. Atherosclerotic plaque is seen at the V4
segment of the left vertebral artery without hemodynamically
significant stenosis. The entire vertebral artery is diminutive in
caliber with focal area of moderate stenosis in the mid basilar
segment. The P1 segment are severely hypoplastic. Luminal
irregularity seen along the bilateral posterior cerebral arteries
with multifocal areas of mild stenoses bilaterally.

Venous sinuses: As permitted by contrast timing, patent.

Anatomic variants: Hypoplastic vertebrobasilar system with bilateral
fetal PCA. Hypoplastic left A1/ACA.

Review of the MIP images confirms the above findings
IMPRESSION: 1. Intracranial atherosclerotic disease superimposed on a
hypoplastic vertebrobasilar system resulting in diminutive caliber
of the basilar artery with focal area of moderate stenosis in the
mid basilar segment.
2. Intracranial atherosclerotic disease in the anterior circulation
with focal moderate stenosis at a left M3/MCA branch and right
A4/ACA branch.
3. Mild atherosclerotic changes of the bilateral carotid bifurcation
without hemodynamically significant stenosis.
4. Prominent paratracheal and subaortic lymph nodes. Correlation
with dedicated chest CT suggested.

## 2023-08-02 DIAGNOSIS — Z299 Encounter for prophylactic measures, unspecified: Secondary | ICD-10-CM | POA: Diagnosis not present

## 2023-08-02 DIAGNOSIS — Z0000 Encounter for general adult medical examination without abnormal findings: Secondary | ICD-10-CM | POA: Diagnosis not present

## 2023-08-02 DIAGNOSIS — E7800 Pure hypercholesterolemia, unspecified: Secondary | ICD-10-CM | POA: Diagnosis not present

## 2023-08-02 DIAGNOSIS — I10 Essential (primary) hypertension: Secondary | ICD-10-CM | POA: Diagnosis not present

## 2023-08-02 DIAGNOSIS — R5383 Other fatigue: Secondary | ICD-10-CM | POA: Diagnosis not present

## 2023-08-13 DIAGNOSIS — R5383 Other fatigue: Secondary | ICD-10-CM | POA: Diagnosis not present

## 2023-08-13 DIAGNOSIS — G459 Transient cerebral ischemic attack, unspecified: Secondary | ICD-10-CM | POA: Diagnosis not present

## 2023-08-13 DIAGNOSIS — E7800 Pure hypercholesterolemia, unspecified: Secondary | ICD-10-CM | POA: Diagnosis not present

## 2023-08-13 DIAGNOSIS — Z0000 Encounter for general adult medical examination without abnormal findings: Secondary | ICD-10-CM | POA: Diagnosis not present

## 2024-04-07 DIAGNOSIS — Z299 Encounter for prophylactic measures, unspecified: Secondary | ICD-10-CM | POA: Diagnosis not present

## 2024-04-07 DIAGNOSIS — Z0000 Encounter for general adult medical examination without abnormal findings: Secondary | ICD-10-CM | POA: Diagnosis not present

## 2024-04-07 DIAGNOSIS — R52 Pain, unspecified: Secondary | ICD-10-CM | POA: Diagnosis not present

## 2024-04-07 DIAGNOSIS — Z1211 Encounter for screening for malignant neoplasm of colon: Secondary | ICD-10-CM | POA: Diagnosis not present

## 2024-04-07 DIAGNOSIS — I10 Essential (primary) hypertension: Secondary | ICD-10-CM | POA: Diagnosis not present
